# Patient Record
Sex: Male | Born: 1980
Health system: Southern US, Community
[De-identification: ages and names within clinical notes are randomized; demographics above are authoritative.]

## PROBLEM LIST (undated history)

## (undated) DIAGNOSIS — E291 Testicular hypofunction: Secondary | ICD-10-CM

## (undated) DIAGNOSIS — I4819 Other persistent atrial fibrillation: Secondary | ICD-10-CM

## (undated) DIAGNOSIS — F17200 Nicotine dependence, unspecified, uncomplicated: Secondary | ICD-10-CM

## (undated) DIAGNOSIS — G479 Sleep disorder, unspecified: Secondary | ICD-10-CM

## (undated) DIAGNOSIS — G43109 Migraine with aura, not intractable, without status migrainosus: Secondary | ICD-10-CM

## (undated) DIAGNOSIS — N50819 Testicular pain, unspecified: Secondary | ICD-10-CM

## (undated) DIAGNOSIS — F988 Other specified behavioral and emotional disorders with onset usually occurring in childhood and adolescence: Secondary | ICD-10-CM

## (undated) DIAGNOSIS — I499 Cardiac arrhythmia, unspecified: Secondary | ICD-10-CM

## (undated) HISTORY — DX: Sleep disorder, unspecified: G47.9

## (undated) HISTORY — DX: Migraine with aura, not intractable, without status migrainosus: G43.109

## (undated) HISTORY — PX: OTHER SURGICAL HISTORY: SHX169

## (undated) HISTORY — DX: Cardiac arrhythmia, unspecified: I49.9

## (undated) HISTORY — DX: Other specified behavioral and emotional disorders with onset usually occurring in childhood and adolescence: F98.8

## (undated) HISTORY — DX: Nicotine dependence, unspecified, uncomplicated: F17.200

## (undated) HISTORY — DX: Testicular pain, unspecified: N50.819

## (undated) HISTORY — DX: Other persistent atrial fibrillation: I48.19

## (undated) HISTORY — DX: Testicular hypofunction: E29.1

---
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
## 2002-02-19 ENCOUNTER — Encounter: Admission: RE | Admit: 2002-02-19 | Discharge: 2002-02-19 | Payer: Self-pay | Admitting: Sports Medicine

## 2003-04-27 ENCOUNTER — Emergency Department (HOSPITAL_COMMUNITY): Admission: EM | Admit: 2003-04-27 | Discharge: 2003-04-27 | Payer: Self-pay

## 2004-12-24 ENCOUNTER — Ambulatory Visit: Payer: Self-pay | Admitting: Family Medicine

## 2005-06-07 ENCOUNTER — Encounter: Admission: RE | Admit: 2005-06-07 | Discharge: 2005-06-07 | Payer: Self-pay | Admitting: Sports Medicine

## 2006-05-19 ENCOUNTER — Ambulatory Visit: Payer: Self-pay | Admitting: Family Medicine

## 2006-06-23 DIAGNOSIS — M25569 Pain in unspecified knee: Secondary | ICD-10-CM | POA: Insufficient documentation

## 2006-08-04 ENCOUNTER — Encounter (INDEPENDENT_AMBULATORY_CARE_PROVIDER_SITE_OTHER): Payer: Self-pay | Admitting: Family Medicine

## 2006-08-04 ENCOUNTER — Ambulatory Visit: Payer: Self-pay | Admitting: Family Medicine

## 2006-08-04 DIAGNOSIS — N63 Unspecified lump in unspecified breast: Secondary | ICD-10-CM | POA: Insufficient documentation

## 2006-08-04 DIAGNOSIS — R3589 Other polyuria: Secondary | ICD-10-CM | POA: Insufficient documentation

## 2006-08-04 DIAGNOSIS — R358 Other polyuria: Secondary | ICD-10-CM

## 2006-08-04 LAB — CONVERTED CEMR LAB
BUN: 15 mg/dL (ref 6–23)
CO2: 23 meq/L (ref 19–32)
Calcium: 9.5 mg/dL (ref 8.4–10.5)
Chlamydia, Swab/Urine, PCR: NEGATIVE
Chloride: 106 meq/L (ref 96–112)
Cholesterol: 157 mg/dL (ref 0–200)
Creatinine, Ser: 1.09 mg/dL (ref 0.40–1.50)
GC Probe Amp, Urine: NEGATIVE
Glucose, Bld: 93 mg/dL (ref 70–99)
HDL: 55 mg/dL (ref >39)
LDL Cholesterol: 90 mg/dL (ref 0–99)
Potassium: 4.1 meq/L (ref 3.5–5.3)
Sodium: 142 meq/L (ref 135–145)
Total CHOL/HDL Ratio: 2.9
Triglycerides: 59 mg/dL (ref <150)
VLDL: 12 mg/dL (ref 0–40)

## 2006-08-09 ENCOUNTER — Encounter (INDEPENDENT_AMBULATORY_CARE_PROVIDER_SITE_OTHER): Payer: Self-pay | Admitting: Family Medicine

## 2006-08-17 ENCOUNTER — Encounter (INDEPENDENT_AMBULATORY_CARE_PROVIDER_SITE_OTHER): Payer: Self-pay | Admitting: Family Medicine

## 2009-01-22 ENCOUNTER — Ambulatory Visit: Payer: Self-pay | Admitting: Family Medicine

## 2009-01-22 ENCOUNTER — Encounter: Payer: Self-pay | Admitting: Family Medicine

## 2009-01-29 ENCOUNTER — Telehealth: Payer: Self-pay | Admitting: Family Medicine

## 2009-01-31 LAB — CONVERTED CEMR LAB
ALT: 34 units/L (ref 0–53)
AST: 19 units/L (ref 0–37)
Albumin: 4.2 g/dL (ref 3.5–5.2)
Alkaline Phosphatase: 81 units/L (ref 39–117)
BUN: 13 mg/dL (ref 6–23)
CO2: 24 meq/L (ref 19–32)
Calcium: 9.3 mg/dL (ref 8.4–10.5)
Chloride: 109 meq/L (ref 96–112)
Creatinine, Ser: 1.02 mg/dL (ref 0.40–1.50)
Glucose, Bld: 108 mg/dL — ABNORMAL HIGH (ref 70–99)
Potassium: 4.1 meq/L (ref 3.5–5.3)
Sodium: 143 meq/L (ref 135–145)
TSH: 1.102 microintl units/mL (ref 0.350–4.500)
Testosterone: 270.13 ng/dL — ABNORMAL LOW (ref 350–890)
Total Bilirubin: 0.5 mg/dL (ref 0.3–1.2)
Total Protein: 6.5 g/dL (ref 6.0–8.3)
hCG, Beta Chain, Quant, S: <2 milliintl units/mL

## 2009-02-21 ENCOUNTER — Telehealth: Payer: Self-pay | Admitting: Family Medicine

## 2009-03-03 ENCOUNTER — Ambulatory Visit: Payer: Self-pay | Admitting: Family Medicine

## 2009-03-03 DIAGNOSIS — E291 Testicular hypofunction: Secondary | ICD-10-CM | POA: Insufficient documentation

## 2009-03-19 ENCOUNTER — Encounter: Admission: RE | Admit: 2009-03-19 | Discharge: 2009-03-19 | Payer: Self-pay | Admitting: Family Medicine

## 2010-10-20 ENCOUNTER — Encounter: Payer: Self-pay | Admitting: Family Medicine

## 2010-10-20 ENCOUNTER — Ambulatory Visit (INDEPENDENT_AMBULATORY_CARE_PROVIDER_SITE_OTHER): Payer: Self-pay | Admitting: Family Medicine

## 2010-10-20 DIAGNOSIS — F988 Other specified behavioral and emotional disorders with onset usually occurring in childhood and adolescence: Secondary | ICD-10-CM

## 2010-10-20 NOTE — Assessment & Plan Note (Signed)
Will attempt to obtain paper charts. Will have patient follow with [facility] for further evaluation. No symptoms concerning for mood disorder, thyroid dysfunction, or sleep apnea as primary cause. Follow up after this evaluation.

## 2010-10-20 NOTE — Progress Notes (Signed)
  Subjective:    Patient ID: Roger Mitchell, male    DOB: 05/30/1980, 30 y.o.   MRN: [id number]  HPI  1) Attention Deficit Disorder: Reports history of ADD diagnosed at age 34. Reports that he had been on Ritalin during middle and high school, stopped taking when he went to college. Did continue to have some difficulties with focusing during college, but did well. Notes that he has difficulties focusing at work - especially on tasks that require close observation or sustained attention (specifically did poorly in these areas on tests related to work). Ritalin did cause some headache at higher doses but he tolerated it well during childhood.   Denies snoring, poor sleep, insomnia, daytime somnolence, depressive symptoms, manic symptoms, hyper / hypothyroid symptoms, medication or drug use, weight loss, appetite change, headaches, vision issues,    Pertinent past history reviewed - unable to pull paper charts from childhood evaluation for ADD   Review of Systems As per HPI     Objective:   Physical Exam General:  Well-developed,well-nourished,in no acute distress; alert,appropriate and cooperative throughout examination Psych: PHQ score = 3, pleasant affect with congruent mood, no depressive manifestations.        Assessment & Plan:

## 2010-10-20 NOTE — Patient Instructions (Signed)
We will attempt to get your old records regarding your ADD history. I would like you to call the number provided in order to have further testing done - this will determine the level of treatment.  Follow up with [place] after you are seen by the ADD clinic - if medication needs to be provided we will do this.   Dr. Wallene Huh

## 2015-08-11 ENCOUNTER — Other Ambulatory Visit: Payer: Self-pay | Admitting: Physician Assistant

## 2015-08-11 DIAGNOSIS — N6012 Diffuse cystic mastopathy of left breast: Secondary | ICD-10-CM

## 2015-08-22 ENCOUNTER — Other Ambulatory Visit: Payer: Self-pay

## 2015-08-28 ENCOUNTER — Other Ambulatory Visit: Payer: Self-pay

## 2015-08-29 ENCOUNTER — Other Ambulatory Visit: Payer: Self-pay

## 2016-12-17 DIAGNOSIS — N50812 Left testicular pain: Secondary | ICD-10-CM | POA: Diagnosis not present

## 2017-01-13 DIAGNOSIS — F900 Attention-deficit hyperactivity disorder, predominantly inattentive type: Secondary | ICD-10-CM | POA: Diagnosis not present

## 2017-03-21 DIAGNOSIS — Z79891 Long term (current) use of opiate analgesic: Secondary | ICD-10-CM | POA: Diagnosis not present

## 2017-03-21 DIAGNOSIS — Z5181 Encounter for therapeutic drug level monitoring: Secondary | ICD-10-CM | POA: Diagnosis not present

## 2017-09-23 DIAGNOSIS — F17298 Nicotine dependence, other tobacco product, with other nicotine-induced disorders: Secondary | ICD-10-CM | POA: Insufficient documentation

## 2018-01-15 NOTE — Progress Notes (Deleted)
    Cardiology Office Note   Date:  01/15/2018   ID:  Roger Mitchell, DOB 03/24/1981, MRN [id number]  PCP:  No primary care provider on file.  Cardiologist:   Mabelle Mungin [place], MD   No chief complaint on file.     History of Present Illness: Roger Mitchell is a 37 y.o. male who is seen at the request of Briscoe BurnsScott Slate PA for evaluation of arrhythmia.    No past medical history on file.  *** The histories are not reviewed yet. Please review them in the "History" navigator section and refresh this SmartLink.   No current outpatient medications on file.   No current facility-administered medications for this visit.     Allergies:   Patient has no known allergies.    Social History:  The patient  reports that he has never smoked. He has never used smokeless tobacco.   Family History:  The patient's ***family history is not on file.    ROS:  Please see the history of present illness.   Otherwise, review of systems are positive for {NONE DEFAULTED:18576::"none"}.   All other systems are reviewed and negative.    PHYSICAL EXAM: VS:  There were no vitals taken for this visit. , BMI There is no height or weight on file to calculate BMI. GEN: Well nourished, well developed, in no acute distress  HEENT: normal  Neck: no JVD, carotid bruits, or masses Cardiac: ***RRR; no murmurs, rubs, or gallops,no edema  Respiratory:  clear to auscultation bilaterally, normal work of breathing GI: soft, nontender, nondistended, + BS MS: no deformity or atrophy  Skin: warm and dry, no rash Neuro:  Strength and sensation are intact Psych: euthymic mood, full affect   EKG:  EKG {ACTION; IS/IS WUJ:[id number]}OT:[id number]} ordered today. The ekg ordered today demonstrates ***   Recent Labs: No results found for requested labs within last 8760 hours.    Lipid Panel    Component Value Date/Time   CHOL 157 08/04/2006 2040   TRIG 59 08/04/2006 2040   HDL 55 08/04/2006 2040   CHOLHDL 2.9 Ratio 08/04/2006  [id number]   VLDL 12 08/04/2006 2040   LDLCALC 90 08/04/2006 2040    Labs dated 04/30/14: cholesterol 205, triglycerides 114, HDL 55, LDL 127.  Dated 03/21/17: creatinine 1.3. Other chemistries and Hgb normal.  Wt Readings from Last 3 Encounters:  10/20/10 179 lb 6.4 oz (81.4 kg)      Other studies Reviewed: Additional studies/ records that were reviewed today include: ***. Review of the above records demonstrates: ***   ASSESSMENT AND PLAN:  1.  ***   Current medicines are reviewed at length with the patient today.  The patient {ACTIONS; HAS/DOES NOT HAVE:19233} concerns regarding medicines.  The following changes have been made:  {PLAN; NO CHANGE:13088:s}  Labs/ tests ordered today include: *** No orders of the defined types were placed in this encounter.    Disposition:   FU with *** in {gen number 2-13:086578}0-10:310397} {Days to years:10300}  Signed, Deni Lefever [place], MD  01/15/2018 2:09 PM    [facility] [address], [place], [place], [id number] Phone [phone], Fax [phone]

## 2018-01-16 ENCOUNTER — Ambulatory Visit: Payer: Self-pay | Admitting: Cardiology

## 2018-01-17 ENCOUNTER — Encounter: Payer: Self-pay | Admitting: *Deleted

## 2018-01-20 NOTE — Progress Notes (Signed)
Referring-Scott Quay Burow PA-C Reason for referral-atrial fibrillation  HPI: 37 year old male for evaluation of atrial fibrillation at request of [employer] PA-C.  Patient apparently was to give blood with [facility] and was found to have an irregular heartbeat.  An electrocardiogram was said to show atrial fibrillation.  Cardiology asked to evaluate.  Note we do not have the electrocardiogram available.  He otherwise is asymptomatic.  He denies dyspnea, chest pain, palpitations or syncope.  Current Outpatient Medications  Medication Sig Dispense Refill  . aspirin 325 MG EC tablet Take 325 mg by mouth daily.    . Melatonin 3 MG TABS Take 2 tablets by mouth at bedtime. As needed for sleep     No current facility-administered medications for this visit.     No Known Allergies   Past Medical History:  Diagnosis Date  . ADD (attention deficit disorder) without hyperactivity   . Atrial fibrillation, persistent   . Irregular heart beat   . Nicotine dependence   . Ocular migraine   . Pain in testicle   . Sleep disorder    shift work  . Testicular hypofunction     Past Surgical History:  Procedure Laterality Date  . none      Social History   Socioeconomic History  . Marital status: Single    Spouse name: Not on file  . Number of children: 1  . Years of education: Not on file  . Highest education level: Not on file  Occupational History  . Not on file  Social Needs  . Financial resource strain: Not on file  . Food insecurity:    Worry: Not on file    Inability: Not on file  . Transportation needs:    Medical: Not on file    Non-medical: Not on file  Tobacco Use  . Smoking status: Never Smoker  . Smokeless tobacco: Never Used  Substance and Sexual Activity  . Alcohol use: Yes    Comment: 6 glasses wine per week  . Drug use: Never  . Sexual activity: Not on file  Lifestyle  . Physical activity:    Days per week: Not on file    Minutes per session: Not  on file  . Stress: Not on file  Relationships  . Social connections:    Talks on phone: Not on file    Gets together: Not on file    Attends religious service: Not on file    Active member of club or organization: Not on file    Attends meetings of clubs or organizations: Not on file    Relationship status: Not on file  . Intimate partner violence:    Fear of current or ex partner: Not on file    Emotionally abused: Not on file    Physically abused: Not on file    Forced sexual activity: Not on file  Other Topics Concern  . Not on file  Social History Narrative  . Not on file    Family History  Problem Relation Age of Onset  . Hypertension Mother   . Hypertension Father   . Hypertension Maternal Grandmother   . Hypertension Maternal Grandfather     ROS: no fevers or chills, productive cough, hemoptysis, dysphasia, odynophagia, melena, hematochezia, dysuria, hematuria, rash, seizure activity, orthopnea, PND, pedal edema, claudication. Remaining systems are negative.  Physical Exam:   Blood pressure 116/80, pulse (!) 59, height 5\' 8"  (1.727 m), weight 184 lb (83.5 kg).  General:  Well developed/well nourished in NAD Skin warm/dry Patient not depressed No peripheral clubbing Back-normal HEENT-normal/normal eyelids Neck supple/normal carotid upstroke bilaterally; no bruits; no JVD; no thyromegaly chest - CTA/ normal expansion CV - RRR/normal S1 and S2; no murmurs, rubs or gallops;  PMI nondisplaced Abdomen -NT/ND, no HSM, no mass, + bowel sounds, no bruit 2+ femoral pulses, no bruits Ext-no edema, chords, 2+ DP Neuro-grossly nonfocal  ECG -sinus bradycardia at a rate of 59.  No ST changes.  Personally reviewed  A/P  1 paroxysmal atrial fibrillation-patient has been diagnosed with atrial fibrillation.  He is in sinus rhythm today.  I will ask for the electrocardiograms to confirm his diagnosis.  Check TSH and echocardiogram.  He has no embolic risk factors. CHADSvasc  0.  We will continue aspirin but decreased to 81 mg daily.  If atrial fibrillation is documented we will consider adding an AV nodal blocking agent.  2 Vaping-pt counseled on discontinuing.  3 ADD-per primary care.  Olga Millers, MD

## 2018-01-24 ENCOUNTER — Encounter: Payer: Self-pay | Admitting: Cardiology

## 2018-01-25 ENCOUNTER — Ambulatory Visit (INDEPENDENT_AMBULATORY_CARE_PROVIDER_SITE_OTHER): Payer: BLUE CROSS/BLUE SHIELD | Admitting: Cardiology

## 2018-01-25 ENCOUNTER — Encounter: Payer: Self-pay | Admitting: Cardiology

## 2018-01-25 VITALS — BP 116/80 | HR 59 | Ht 68.0 in | Wt 184.0 lb

## 2018-01-25 DIAGNOSIS — I480 Paroxysmal atrial fibrillation: Secondary | ICD-10-CM

## 2018-01-25 LAB — TSH: TSH: 1.78 u[IU]/mL (ref 0.450–4.500)

## 2018-01-25 MED ORDER — ASPIRIN EC 81 MG PO TBEC: 81.0000 mg | DELAYED_RELEASE_TABLET | Freq: Every day | ORAL | Status: DC

## 2018-01-25 NOTE — Patient Instructions (Signed)
Medication Instructions:   DECREASE ASPIRIN TO 81 MG ONCE DAILY  Labwork:  Your physician recommends that you HAVE LAB WORK TODAY  Testing/Procedures:  Your physician has requested that you have an echocardiogram. Echocardiography is a painless test that uses sound waves to create images of your heart. It provides your doctor with information about the size and shape of your heart and how well your heart's chambers and valves are working. This procedure takes approximately one hour. There are no restrictions for this procedure.    Follow-Up:  Your physician wants you to follow-up in: 4 MONTHS WITH DR Jens Som You will receive a reminder letter in the mail two months in advance. If you don't receive a letter, please call our office to schedule the follow-up appointment.

## 2018-01-26 ENCOUNTER — Encounter: Payer: Self-pay | Admitting: *Deleted

## 2018-01-30 ENCOUNTER — Ambulatory Visit: Payer: Self-pay | Admitting: Cardiovascular Disease

## 2018-02-14 ENCOUNTER — Other Ambulatory Visit (HOSPITAL_COMMUNITY): Payer: BLUE CROSS/BLUE SHIELD

## 2018-02-14 ENCOUNTER — Telehealth: Payer: Self-pay | Admitting: *Deleted

## 2018-02-14 DIAGNOSIS — R0989 Other specified symptoms and signs involving the circulatory and respiratory systems: Secondary | ICD-10-CM

## 2018-02-14 NOTE — Telephone Encounter (Signed)
EKG from Roger slate pa reviewed by dr Jens Som and shows atrial fib. Left message for pt to call, per dr Jens Som patient to start toprol 25 mg once daily and to follow up as scheduled.

## 2018-02-28 ENCOUNTER — Encounter (HOSPITAL_COMMUNITY): Payer: Self-pay | Admitting: Cardiology

## 2018-03-09 ENCOUNTER — Telehealth: Payer: Self-pay

## 2018-03-09 NOTE — Telephone Encounter (Signed)
New message    Just an FYI. We have made several attempts to contact this patient including sending a letter to schedule or reschedule their echocardiogram. We will be removing the patient from the echo WQ.   Thank you 

## 2018-03-27 ENCOUNTER — Encounter: Payer: Self-pay | Admitting: *Deleted

## 2018-03-27 NOTE — Telephone Encounter (Signed)
Letter mailed to the patient asking him to call

## 2018-05-05 ENCOUNTER — Telehealth: Payer: Self-pay | Admitting: Cardiology

## 2018-05-05 MED ORDER — METOPROLOL SUCCINATE ER 25 MG PO TB24: 25.0000 mg | ORAL_TABLET | Freq: Every day | ORAL | 3 refills | Status: DC

## 2018-05-05 NOTE — Telephone Encounter (Signed)
New Message   Patient is calling in reference to a letter that he received about his echocardiogram dated 12/02 saying that he has Afib. . Please call to discuss.

## 2018-05-05 NOTE — Telephone Encounter (Signed)
Spoke with pt, he is not having any palpitations or problems at this time. He has a lot of questions regarding atrial fib and the need for the metropolol. Tried to answer his questions. New script sent to the pharmacy. He has a follow up appt 05-29-18.

## 2018-05-05 NOTE — Telephone Encounter (Signed)
Follow up:   Patient returning call concerning some paper work. Please call patient.

## 2018-05-10 ENCOUNTER — Telehealth: Payer: Self-pay | Admitting: Cardiology

## 2018-05-10 MED ORDER — METOPROLOL SUCCINATE ER 25 MG PO TB24: 25.0000 mg | ORAL_TABLET | Freq: Every day | ORAL | 3 refills | Status: DC

## 2018-05-10 NOTE — Telephone Encounter (Signed)
Dr. Eula Fried updated with Dr. Ludwig Clarks recommendation. He states pt was seen in the office today and reports he medication was never sent to his pharmacy. Contacted pt who states Rx was supposed to be sent to [facility] instead of [facility]. New Rx sent to correct pharmacy. Pt made aware.

## 2018-05-10 NOTE — Telephone Encounter (Signed)
New Message   Dr is with pt and is calling because the pt is not sure what his medical plan is with [place] . Please call

## 2018-05-17 NOTE — Progress Notes (Signed)
HPI: FU atrial fibrillation.  Patient apparently was to give blood with [facility] and was found to have an irregular heartbeat.  An electrocardiogram January 12, 2018 showed atrial fibrillation.  At last office visit patient was in sinus rhythm.  Echo ordered at last office visit but not performed.  TSH normal.  Since last seen the patient denies any dyspnea on exertion, orthopnea, PND, pedal edema, palpitations, syncope or chest pain.   Current Outpatient Medications  Medication Sig Dispense Refill  . amphetamine-dextroamphetamine (ADDERALL XR) 10 MG 24 hr capsule Take 10 mg by mouth daily.    Marland Kitchen. aspirin 81 MG tablet Take 1 tablet (81 mg total) by mouth daily.    . Melatonin 3 MG TABS Take 2 tablets by mouth at bedtime. As needed for sleep    . metoprolol succinate (TOPROL XL) 25 MG 24 hr tablet Take 1 tablet (25 mg total) by mouth daily. 30 tablet 3   No current facility-administered medications for this visit.      Past Medical History:  Diagnosis Date  . ADD (attention deficit disorder) without hyperactivity   . Atrial fibrillation, persistent   . Irregular heart beat   . Nicotine dependence   . Ocular migraine   . Pain in testicle   . Sleep disorder    shift work  . Testicular hypofunction     Past Surgical History:  Procedure Laterality Date  . none      Social History   Socioeconomic History  . Marital status: Single    Spouse name: Not on file  . Number of children: 1  . Years of education: Not on file  . Highest education level: Not on file  Occupational History  . Not on file  Social Needs  . Financial resource strain: Not on file  . Food insecurity:    Worry: Not on file    Inability: Not on file  . Transportation needs:    Medical: Not on file    Non-medical: Not on file  Tobacco Use  . Smoking status: Never Smoker  . Smokeless tobacco: Never Used  Substance and Sexual Activity  . Alcohol use: Yes    Comment: 6 glasses wine per week  .  Drug use: Never  . Sexual activity: Not on file  Lifestyle  . Physical activity:    Days per week: Not on file    Minutes per session: Not on file  . Stress: Not on file  Relationships  . Social connections:    Talks on phone: Not on file    Gets together: Not on file    Attends religious service: Not on file    Active member of club or organization: Not on file    Attends meetings of clubs or organizations: Not on file    Relationship status: Not on file  . Intimate partner violence:    Fear of current or ex partner: Not on file    Emotionally abused: Not on file    Physically abused: Not on file    Forced sexual activity: Not on file  Other Topics Concern  . Not on file  Social History Narrative  . Not on file    Family History  Problem Relation Age of Onset  . Hypertension Mother   . Hypertension Father   . Hypertension Maternal Grandmother   . Hypertension Maternal Grandfather     ROS: no fevers or chills, productive cough, hemoptysis, dysphasia, odynophagia, melena, hematochezia, dysuria,  hematuria, rash, seizure activity, orthopnea, PND, pedal edema, claudication. Remaining systems are negative.  Physical Exam: Well-developed well-nourished in no acute distress.  Skin is warm and dry.  HEENT is normal.  Neck is supple.  Chest is clear to auscultation with normal expansion.  Cardiovascular exam is regular rate and rhythm.  Abdominal exam nontender or distended. No masses palpated. Extremities show no edema. neuro grossly intact  A/P  1 patient is in sinus rhythm today on examination.  Previous electrocardiogram confirmed the diagnosis of atrial fibrillation.  I have asked him to proceed with echocardiogram to assess LV function. CHADSvasc 0.  Continue aspirin 81 mg daily.  Continue Toprol 25 mg daily.  2 history of vaping-patient again counseled on discontinuing.  3 ADD-Per primary care.  Olga MillersBrian Crenshaw, MD

## 2018-05-29 ENCOUNTER — Ambulatory Visit (INDEPENDENT_AMBULATORY_CARE_PROVIDER_SITE_OTHER): Payer: BLUE CROSS/BLUE SHIELD | Admitting: Cardiology

## 2018-05-29 ENCOUNTER — Encounter: Payer: Self-pay | Admitting: Cardiology

## 2018-05-29 VITALS — BP 116/86 | HR 67 | Ht 68.0 in | Wt 185.0 lb

## 2018-05-29 DIAGNOSIS — I480 Paroxysmal atrial fibrillation: Secondary | ICD-10-CM | POA: Diagnosis not present

## 2018-05-29 NOTE — Patient Instructions (Signed)
Medication Instructions:  NO CHANGE If you need a refill on your cardiac medications before your next appointment, please call your pharmacy.   Lab work: If you have labs (blood work) drawn today and your tests are completely normal, you will receive your results only by: . MyChart Message (if you have MyChart) OR . A paper copy in the mail If you have any lab test that is abnormal or we need to change your treatment, we will call you to review the results.  Testing/Procedures: Your physician has requested that you have an echocardiogram. Echocardiography is a painless test that uses sound waves to create images of your heart. It provides your doctor with information about the size and shape of your heart and how well your heart's chambers and valves are working. This procedure takes approximately one hour. There are no restrictions for this procedure.  [address]  Follow-Up: At [facility], you and your health needs are our priority.  As part of our continuing mission to provide you with exceptional heart care, we have created designated Provider Care Teams.  These Care Teams include your primary Cardiologist (physician) and Advanced Practice Providers (APPs -  Physician Assistants and Nurse Practitioners) who all work together to provide you with the care you need, when you need it. You will need a follow up appointment in 12 months.  Please call our office 2 months in advance to schedule this appointment.  You may see BRIAN CRENSHAW MD or one of the following Advanced Practice Providers on your designated Care Team:   Luke Kilroy, PA-C Krista Kroeger, PA-C . Callie Goodrich, PA-C CALL IN December TO SCHEDULE APPOINTMENT IN FEBRUARY  

## 2018-06-07 ENCOUNTER — Other Ambulatory Visit (HOSPITAL_COMMUNITY): Payer: BLUE CROSS/BLUE SHIELD

## 2018-06-13 ENCOUNTER — Other Ambulatory Visit (HOSPITAL_COMMUNITY): Payer: BLUE CROSS/BLUE SHIELD

## 2018-06-13 DIAGNOSIS — R0989 Other specified symptoms and signs involving the circulatory and respiratory systems: Secondary | ICD-10-CM

## 2018-06-20 ENCOUNTER — Encounter: Payer: Self-pay | Admitting: Cardiology

## 2018-07-03 ENCOUNTER — Other Ambulatory Visit (HOSPITAL_COMMUNITY): Payer: BLUE CROSS/BLUE SHIELD

## 2018-07-07 ENCOUNTER — Telehealth: Payer: Self-pay

## 2018-07-07 NOTE — Telephone Encounter (Signed)
Forward to Dr Crenshaw 

## 2018-07-07 NOTE — Telephone Encounter (Signed)
New message    Just an FYI.  We have made several attempts to contact this patient including sending a letter to schedule or reschedule their echocardiogram. We will be removing the patient from the echo WQ.  02/14/2018   cancel   06/07/2018     cancel 06/13/2018   patient no show   3/9/ 2020     patient no show    Thank you

## 2018-10-24 DIAGNOSIS — I4891 Unspecified atrial fibrillation: Secondary | ICD-10-CM | POA: Diagnosis not present

## 2018-12-01 DIAGNOSIS — F900 Attention-deficit hyperactivity disorder, predominantly inattentive type: Secondary | ICD-10-CM | POA: Diagnosis not present

## 2018-12-15 ENCOUNTER — Encounter (HOSPITAL_COMMUNITY): Payer: Self-pay | Admitting: *Deleted

## 2018-12-15 ENCOUNTER — Emergency Department (HOSPITAL_COMMUNITY)
Admission: EM | Admit: 2018-12-15 | Discharge: 2018-12-16 | Disposition: A | Discharge status: Home / Self Care | Payer: BC Managed Care – PPO | Attending: Emergency Medicine | Admitting: Emergency Medicine

## 2018-12-15 ENCOUNTER — Emergency Department (HOSPITAL_COMMUNITY): Payer: BC Managed Care – PPO

## 2018-12-15 ENCOUNTER — Other Ambulatory Visit: Payer: Self-pay

## 2018-12-15 DIAGNOSIS — Z7982 Long term (current) use of aspirin: Secondary | ICD-10-CM | POA: Insufficient documentation

## 2018-12-15 DIAGNOSIS — R110 Nausea: Secondary | ICD-10-CM | POA: Diagnosis not present

## 2018-12-15 DIAGNOSIS — R0789 Other chest pain: Secondary | ICD-10-CM | POA: Insufficient documentation

## 2018-12-15 DIAGNOSIS — R079 Chest pain, unspecified: Secondary | ICD-10-CM | POA: Diagnosis not present

## 2018-12-15 DIAGNOSIS — R1013 Epigastric pain: Secondary | ICD-10-CM | POA: Insufficient documentation

## 2018-12-15 DIAGNOSIS — Z79899 Other long term (current) drug therapy: Secondary | ICD-10-CM | POA: Diagnosis not present

## 2018-12-15 LAB — BASIC METABOLIC PANEL
Anion gap: 11 (ref 5–15)
BUN: 15 mg/dL (ref 6–20)
CO2: 24 mmol/L (ref 22–32)
Calcium: 9.1 mg/dL (ref 8.9–10.3)
Chloride: 103 mmol/L (ref 98–111)
Creatinine, Ser: 1.27 mg/dL — ABNORMAL HIGH (ref 0.61–1.24)
GFR calc Af Amer: >60 mL/min (ref >60)
GFR calc non Af Amer: >60 mL/min (ref >60)
Glucose, Bld: 88 mg/dL (ref 70–99)
Potassium: 4 mmol/L (ref 3.5–5.1)
Sodium: 138 mmol/L (ref 135–145)

## 2018-12-15 LAB — CBC
HCT: 46.4 % (ref 39.0–52.0)
Hemoglobin: 16.6 g/dL (ref 13.0–17.0)
MCH: 31.6 pg (ref 26.0–34.0)
MCHC: 35.8 g/dL (ref 30.0–36.0)
MCV: 88.4 fL (ref 80.0–100.0)
Platelets: 248 10*3/uL (ref 150–400)
RBC: 5.25 MIL/uL (ref 4.22–5.81)
RDW: 11.4 % — ABNORMAL LOW (ref 11.5–15.5)
WBC: 6.9 10*3/uL (ref 4.0–10.5)
nRBC: 0 % (ref 0.0–0.2)

## 2018-12-15 LAB — TROPONIN I (HIGH SENSITIVITY): Troponin I (High Sensitivity): 3 ng/L (ref <18)

## 2018-12-15 MED ORDER — SODIUM CHLORIDE 0.9% FLUSH: 3.0000 mL | Freq: Once | INTRAVENOUS | Status: DC

## 2018-12-15 NOTE — ED Triage Notes (Signed)
The pt had an episode earlier today with weakness nausea and some xchest pain  He went to urgent care and they saw something on his his ekg  He has a history of af

## 2018-12-16 LAB — TROPONIN I (HIGH SENSITIVITY): Troponin I (High Sensitivity): 3 ng/L (ref <18)

## 2018-12-16 MED ORDER — PANTOPRAZOLE SODIUM 20 MG PO TBEC: 20.0000 mg | DELAYED_RELEASE_TABLET | Freq: Every day | ORAL | 0 refills | Status: DC

## 2018-12-16 MED ORDER — ALUM & MAG HYDROXIDE-SIMETH 200-200-20 MG/5ML PO SUSP
30.0000 mL | Freq: Once | ORAL | Status: AC
  Administered 2018-12-16: 30 mL via ORAL
  Filled 2018-12-16: qty 30

## 2018-12-16 MED ORDER — FAMOTIDINE 20 MG PO TABS: 20.0000 mg | ORAL_TABLET | Freq: Two times a day (BID) | ORAL | 0 refills | Status: DC

## 2018-12-16 NOTE — ED Provider Notes (Signed)
[facility] Provider Note   CSN: [id number] Arrival date & time: 12/15/18  2115     History   Chief Complaint Chief Complaint  Patient presents with  . Chest Pain    HPI Roger Mitchell is a 38 y.o. male.     Patient with some intermittent epigastric retrosternal chest pain.  Patient states that this is been going on for a couple days intermittently.  Went to urgent care they were worried about an EKG so sent him here with her work-up.  No cough or fevers.  The history is provided by the patient.  Chest Pain Pain location:  L chest and substernal area Pain quality: sharp   Pain quality: not aching   Pain radiates to:  Does not radiate Pain severity:  Mild Duration:  2 days Timing:  Intermittent Chronicity:  New Relieved by:  None tried Worsened by:  Nothing Ineffective treatments:  None tried Associated symptoms: abdominal pain and nausea   Associated symptoms: no back pain, no cough, no fever, no heartburn, no lower extremity edema and no near-syncope     Past Medical History:  Diagnosis Date  . ADD (attention deficit disorder) without hyperactivity   . Atrial fibrillation, persistent   . Irregular heart beat   . Nicotine dependence   . Ocular migraine   . Pain in testicle   . Sleep disorder    shift work  . Testicular hypofunction     Patient Active Problem List   Diagnosis Date Noted  . Adult attention deficit disorder 10/20/2010  . TESTICULAR HYPOFUNCTION 03/03/2009  . BREAST MASS, LEFT 08/04/2006  . SYMPTOM, POLYURIA 08/04/2006  . PATELLO FEMORAL STRESS SYNDROME 06/23/2006    Past Surgical History:  Procedure Laterality Date  . none          Home Medications    Prior to Admission medications   Medication Sig Start Date End Date Taking? Authorizing Provider  amphetamine-dextroamphetamine (ADDERALL XR) 10 MG 24 hr capsule Take 10 mg by mouth daily.    [provider]  aspirin 81 MG tablet  Take 1 tablet (81 mg total) by mouth daily. 01/25/18   Lewayne Buntingrenshaw, Brian S, MD  famotidine (PEPCID) 20 MG tablet Take 1 tablet (20 mg total) by mouth 2 (two) times daily. 12/16/18   Armella Stogner, Barbara CowerJason, MD  Melatonin 3 MG TABS Take 2 tablets by mouth at bedtime. As needed for sleep    [provider]  metoprolol succinate (TOPROL XL) 25 MG 24 hr tablet Take 1 tablet (25 mg total) by mouth daily. 05/10/18   Lewayne Buntingrenshaw, Brian S, MD  pantoprazole (PROTONIX) 20 MG tablet Take 1 tablet (20 mg total) by mouth daily. 12/16/18   Kairee Isa, Barbara CowerJason, MD    Family History Family History  Problem Relation Age of Onset  . Hypertension Mother   . Hypertension Father   . Hypertension Maternal Grandmother   . Hypertension Maternal Grandfather     Social History Social History   Tobacco Use  . Smoking status: Never Smoker  . Smokeless tobacco: Never Used  Substance Use Topics  . Alcohol use: Yes    Comment: 6 glasses wine per week  . Drug use: Never     Allergies   Patient has no known allergies.   Review of Systems Review of Systems  Constitutional: Negative for fever.  Respiratory: Negative for cough.   Cardiovascular: Positive for chest pain. Negative for near-syncope.  Gastrointestinal: Positive for abdominal pain and nausea.  Negative for heartburn.  Musculoskeletal: Negative for back pain.  All other systems reviewed and are negative.    Physical Exam Updated Vital Signs BP (!) 140/91   Pulse 70   Temp 98.8 F (37.1 C) (Oral)   Resp 13   Ht 5\' 8"  (1.727 m)   Wt 83.5 kg   SpO2 98%   BMI 27.98 kg/m   Physical Exam Vitals signs and nursing note reviewed.  Constitutional:      Appearance: He is well-developed.  HENT:     Head: Normocephalic and atraumatic.     Nose: Nose normal. No congestion or rhinorrhea.     Mouth/Throat:     Mouth: Mucous membranes are dry.     Pharynx: Oropharynx is clear.  Eyes:     Conjunctiva/sclera: Conjunctivae normal.  Neck:     Musculoskeletal:  Normal range of motion.  Cardiovascular:     Rate and Rhythm: Normal rate.  Pulmonary:     Effort: Pulmonary effort is normal. No respiratory distress.  Abdominal:     General: There is no distension.  Musculoskeletal: Normal range of motion.  Skin:    General: Skin is warm and dry.  Neurological:     General: No focal deficit present.     Mental Status: He is alert.      ED Treatments / Results  Labs (all labs ordered are listed, but only abnormal results are displayed) Labs Reviewed  BASIC METABOLIC PANEL - Abnormal; Notable for the following components:      Result Value   Creatinine, Ser 1.27 (*)    All other components within normal limits  CBC - Abnormal; Notable for the following components:   RDW 11.4 (*)    All other components within normal limits  TROPONIN I (HIGH SENSITIVITY)  TROPONIN I (HIGH SENSITIVITY)    EKG EKG Interpretation  Date/Time:  Friday December 15 2018 21:33:08 EDT Ventricular Rate:  74 PR Interval:  134 QRS Duration: 94 QT Interval:  352 QTC Calculation: 390 R Axis:   87 Text Interpretation:  Sinus rhythm with marked sinus arrhythmia Otherwise normal ECG No old tracing to compare Confirmed by Merrily Pew [phone]) on 12/16/2018 5:11:13 AM   Radiology Dg Chest 2 View  Result Date: 12/15/2018 CLINICAL DATA:  Chest pain today. History of atrial fibrillation. EXAM: CHEST - 2 VIEW COMPARISON:  None. FINDINGS: The cardiomediastinal contours are normal. The lungs are clear. Pulmonary vasculature is normal. No consolidation, pleural effusion, or pneumothorax. No acute osseous abnormalities are seen. IMPRESSION: Negative radiographs of the chest. Electronically Signed   By: Keith Rake M.D.   On: 12/15/2018 22:31    Procedures Procedures (including critical care time)  Medications Ordered in ED Medications  alum & mag hydroxide-simeth (MAALOX/MYLANTA) 200-200-20 MG/5ML suspension 30 mL (30 mLs Oral Given 12/16/18 0540)     Initial  Impression / Assessment and Plan / ED Course  I have reviewed the triage vital signs and the nursing notes.  Pertinent labs & imaging results that were available during my care of the patient were reviewed by me and considered in my medical decision making (see chart for details).  Low suspicion for ACS, PE or other acute causes.  At this time I think is most likely be consistent with a GI cause.  We will treated for the same.  PCP follow-up.   Final Clinical Impressions(s) / ED Diagnoses   Final diagnoses:  Nonspecific chest pain    ED Discharge Orders  Ordered    pantoprazole (PROTONIX) 20 MG tablet  Daily     12/16/18 0531    famotidine (PEPCID) 20 MG tablet  2 times daily     12/16/18 0531           Giulliana Mcroberts, Barbara CowerJason, MD 12/16/18 2325

## 2018-12-16 NOTE — ED Notes (Signed)
Patient verbalizes understanding of discharge instructions. Opportunity for questioning and answers were provided. Armband removed by staff, pt discharged from ED.  

## 2018-12-18 DIAGNOSIS — F900 Attention-deficit hyperactivity disorder, predominantly inattentive type: Secondary | ICD-10-CM | POA: Diagnosis not present

## 2019-03-29 ENCOUNTER — Other Ambulatory Visit: Payer: Self-pay

## 2019-03-29 ENCOUNTER — Emergency Department (HOSPITAL_COMMUNITY)
Admission: EM | Admit: 2019-03-29 | Discharge: 2019-03-29 | Disposition: A | Discharge status: Home / Self Care | Payer: No Typology Code available for payment source | Attending: Emergency Medicine | Admitting: Emergency Medicine

## 2019-03-29 ENCOUNTER — Emergency Department (HOSPITAL_COMMUNITY): Payer: No Typology Code available for payment source

## 2019-03-29 DIAGNOSIS — Z7982 Long term (current) use of aspirin: Secondary | ICD-10-CM | POA: Diagnosis not present

## 2019-03-29 DIAGNOSIS — M545 Low back pain: Secondary | ICD-10-CM | POA: Insufficient documentation

## 2019-03-29 DIAGNOSIS — Y939 Activity, unspecified: Secondary | ICD-10-CM | POA: Insufficient documentation

## 2019-03-29 DIAGNOSIS — M25511 Pain in right shoulder: Secondary | ICD-10-CM | POA: Diagnosis not present

## 2019-03-29 DIAGNOSIS — Y92410 Unspecified street and highway as the place of occurrence of the external cause: Secondary | ICD-10-CM | POA: Insufficient documentation

## 2019-03-29 DIAGNOSIS — R519 Headache, unspecified: Secondary | ICD-10-CM | POA: Insufficient documentation

## 2019-03-29 DIAGNOSIS — Z79899 Other long term (current) drug therapy: Secondary | ICD-10-CM | POA: Insufficient documentation

## 2019-03-29 DIAGNOSIS — S0081XA Abrasion of other part of head, initial encounter: Secondary | ICD-10-CM | POA: Insufficient documentation

## 2019-03-29 DIAGNOSIS — S0990XA Unspecified injury of head, initial encounter: Secondary | ICD-10-CM | POA: Diagnosis present

## 2019-03-29 DIAGNOSIS — Y999 Unspecified external cause status: Secondary | ICD-10-CM | POA: Diagnosis not present
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
## 2019-03-29 NOTE — ED Notes (Signed)
An After Visit Summary was printed and given to the patient. Discharge instructions given and no further questions at this time. Pt ambulatory at discharge with no difficulty.  
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
## 2019-03-29 NOTE — Discharge Instructions (Addendum)
Use ibuprofen for pain.  Use ice for couple of days after that use heat.

## 2019-06-04 NOTE — Progress Notes (Deleted)
HPI: FU atrial fibrillation.Patient apparently was to give blood with [employer] and was found to have an irregular heartbeat. An electrocardiogram January 12, 2018 showed atrial fibrillation.  At last office visit patient was in sinus rhythm.  Echo ordered at last office visit but not performed. Since last seen   Current Outpatient Medications  Medication Sig Dispense Refill  . amphetamine-dextroamphetamine (ADDERALL XR) 10 MG 24 hr capsule Take 10 mg by mouth daily.    Marland Kitchen aspirin 81 MG tablet Take 1 tablet (81 mg total) by mouth daily.    . famotidine (PEPCID) 20 MG tablet Take 1 tablet (20 mg total) by mouth 2 (two) times daily. 20 tablet 0  . Melatonin 3 MG TABS Take 2 tablets by mouth at bedtime. As needed for sleep    . metoprolol succinate (TOPROL XL) 25 MG 24 hr tablet Take 1 tablet (25 mg total) by mouth daily. 30 tablet 3  . pantoprazole (PROTONIX) 20 MG tablet Take 1 tablet (20 mg total) by mouth daily. 10 tablet 0   No current facility-administered medications for this visit.     Past Medical History:  Diagnosis Date  . ADD (attention deficit disorder) without hyperactivity   . Atrial fibrillation, persistent   . Irregular heart beat   . Nicotine dependence   . Ocular migraine   . Pain in testicle   . Sleep disorder    shift work  . Testicular hypofunction     Past Surgical History:  Procedure Laterality Date  . none      Social History   Socioeconomic History  . Marital status: Single    Spouse name: Not on file  . Number of children: 1  . Years of education: Not on file  . Highest education level: Not on file  Occupational History  . Not on file  Tobacco Use  . Smoking status: Never Smoker  . Smokeless tobacco: Never Used  Substance and Sexual Activity  . Alcohol use: Yes    Comment: 6 glasses wine per week  . Drug use: Never  . Sexual activity: Not on file  Other Topics Concern  . Not on file  Social History Narrative  . Not on file   Social Determinants of Health   Financial Resource Strain:   . Difficulty of Paying Living Expenses: Not on file  Food Insecurity:   . Worried About Charity fundraiser in the Last Year: Not on file  . Ran Out of Food in the Last Year: Not on file  Transportation Needs:   . Lack of Transportation (Medical): Not on file  . Lack of Transportation (Non-Medical): Not on file  Physical Activity:   . Days of Exercise per Week: Not on file  . Minutes of Exercise per Session: Not on file  Stress:   . Feeling of Stress : Not on file  Social Connections:   . Frequency of Communication with Friends and Family: Not on file  . Frequency of Social Gatherings with Friends and Family: Not on file  . Attends Religious Services: Not on file  . Active Member of Clubs or Organizations: Not on file  . Attends Archivist Meetings: Not on file  . Marital Status: Not on file  Intimate Partner Violence:   . Fear of Current or Ex-Partner: Not on file  . Emotionally Abused: Not on file  . Physically Abused: Not on file  . Sexually Abused: Not on file  Family History  Problem Relation Age of Onset  . Hypertension Mother   . Hypertension Father   . Hypertension Maternal Grandmother   . Hypertension Maternal Grandfather     ROS: no fevers or chills, productive cough, hemoptysis, dysphasia, odynophagia, melena, hematochezia, dysuria, hematuria, rash, seizure activity, orthopnea, PND, pedal edema, claudication. Remaining systems are negative.  Physical Exam: Well-developed well-nourished in no acute distress.  Skin is warm and dry.  HEENT is normal.  Neck is supple.  Chest is clear to auscultation with normal expansion.  Cardiovascular exam is regular rate and rhythm.  Abdominal exam nontender or distended. No masses palpated. Extremities show no edema. neuro grossly intact  ECG- personally reviewed  A/P  1 paroxysmal atrial fibrillation-patient remains in sinus rhythm today.  We  ordered an echocardiogram at last office visit and we will reschedule.  Continue Toprol at present dose.  Continue aspirin 81 mg daily. CHADSvasc 0.   2 ADD-per primary care  3 H/O vaping-patient counseled on discontinuing.  Olga Millers, MD

## 2019-06-13 ENCOUNTER — Ambulatory Visit: Payer: Self-pay | Admitting: Cardiology

## 2019-07-16 ENCOUNTER — Encounter: Payer: Self-pay | Admitting: Cardiology

## 2019-07-24 ENCOUNTER — Telehealth (INDEPENDENT_AMBULATORY_CARE_PROVIDER_SITE_OTHER): Payer: Self-pay | Admitting: Cardiology

## 2019-07-24 ENCOUNTER — Encounter: Payer: Self-pay | Admitting: Cardiology

## 2019-07-24 VITALS — Ht 68.5 in | Wt 194.0 lb

## 2019-07-24 DIAGNOSIS — I480 Paroxysmal atrial fibrillation: Secondary | ICD-10-CM

## 2019-07-24 DIAGNOSIS — R072 Precordial pain: Secondary | ICD-10-CM

## 2019-07-24 MED ORDER — METOPROLOL SUCCINATE ER 25 MG PO TB24: 25.0000 mg | ORAL_TABLET | Freq: Every day | ORAL | 3 refills | Status: DC

## 2019-07-24 NOTE — Patient Instructions (Signed)
Medication Instructions:  START METOPROLOL SUCC ER 25 MG ONCE DAILY  *If you need a refill on your cardiac medications before your next appointment, please call your pharmacy*   Lab Work: Your physician recommends that you return for lab work WHEN CONVENIENT  If you have labs (blood work) drawn today and your tests are completely normal, you will receive your results only by: Marland Kitchen MyChart Message (if you have MyChart) OR . A paper copy in the mail If you have any lab test that is abnormal or we need to change your treatment, we will call you to review the results.   Testing/Procedures: Your physician has requested that you have an echocardiogram. Echocardiography is a painless test that uses sound waves to create images of your heart. It provides your doctor with information about the size and shape of your heart and how well your heart's chambers and valves are working. This procedure takes approximately one hour. There are no restrictions for this procedure.[address]   Follow-Up: At [facility], you and your health needs are our priority.  As part of our continuing mission to provide you with exceptional heart care, we have created designated Provider Care Teams.  These Care Teams include your primary Cardiologist (physician) and Advanced Practice Providers (APPs -  Physician Assistants and Nurse Practitioners) who all work together to provide you with the care you need, when you need it.  We recommend signing up for the patient portal called "MyChart".  Sign up information is provided on this After Visit Summary.  MyChart is used to connect with patients for Virtual Visits (Telemedicine).  Patients are able to view lab/test results, encounter notes, upcoming appointments, etc.  Non-urgent messages can be sent to your provider as well.   To learn more about what you can do with MyChart, go to [email or web address].    Your next appointment:   12 month(s)  The format for  your next appointment:   Either In Person or Virtual  Provider:   You may see Olga Millers MD or one of the following Advanced Practice Providers on your designated Care Team:    Corine Shelter, PA-C  [place], [place]  Edd Fabian, [place]

## 2019-07-24 NOTE — Progress Notes (Signed)
Virtual Visit via Video Note    This visit type was conducted due to national recommendations for restrictions regarding the COVID-19 Pandemic (e.g. social distancing) in an effort to limit this patient's exposure and mitigate transmission in our community.  Due to his co-morbid illnesses, this patient is at least at moderate risk for complications without adequate follow up.  This format is felt to be most appropriate for this patient at this time.  All issues noted in this document were discussed and addressed.  A limited physical exam was performed with this format.  Please refer to the patient's chart for his consent to telehealth for [facility].   Date:  07/24/2019   ID:  Roger Mitchell, DOB 02/01/1981, MRN [id number]  Patient Location:Home Provider Location: Home  PCP:  Lenis Noon, PA-C  Cardiologist:  Dr Stanford Breed  Evaluation Performed:  Follow-Up Visit  Chief Complaint:  FU atrial fibrillation  History of Present Illness:    FU atrial fibrillation.Patient apparently was to give blood with [facility] and was found to have an irregular heartbeat. An electrocardiogram January 12, 2018 showed atrial fibrillation.  At [facility] patient was in sinus rhythm.  Echo ordered at previous office visit but not performed.  TSH normal.  Since last seen patient states that 3 days ago he had chest pain for greater than 24 hours.  The pain was under his left breast without radiation.  It increased with lying in certain positions and improved with changing positions.  It also increased with inspiration.  He denies associated dyspnea or diaphoresis.  Minimal nausea.  Prior to that he denies exertional chest pain or dyspnea on exertion.  He did travel to [place] and back 1 week ago.  The patient does not have symptoms concerning for COVID-19 infection (fever, chills, cough, or new shortness of breath).    Past Medical History:  Diagnosis Date  . ADD (attention deficit disorder) without  hyperactivity   . Atrial fibrillation, persistent ([place])   . Irregular heart beat   . Nicotine dependence   . Ocular migraine   . Pain in testicle   . Sleep disorder    shift work  . Testicular hypofunction    Past Surgical History:  Procedure Laterality Date  . none       Current Meds  Medication Sig  . aspirin 81 MG tablet Take 1 tablet (81 mg total) by mouth daily.  . Melatonin 3 MG TABS Take 2 tablets by mouth at bedtime. As needed for sleep  . [DISCONTINUED] amphetamine-dextroamphetamine (ADDERALL XR) 10 MG 24 hr capsule Take 10 mg by mouth daily.  . [DISCONTINUED] famotidine (PEPCID) 20 MG tablet Take 1 tablet (20 mg total) by mouth 2 (two) times daily.  . [DISCONTINUED] metoprolol succinate (TOPROL XL) 25 MG 24 hr tablet Take 1 tablet (25 mg total) by mouth daily.     Allergies:   Patient has no known allergies.   Social History   Tobacco Use  . Smoking status: Never Smoker  . Smokeless tobacco: Never Used  Substance Use Topics  . Alcohol use: Yes    Comment: 6 glasses wine per week  . Drug use: Never     Family Hx: The patient's family history includes Hypertension in his father, maternal grandfather, maternal grandmother, and mother.  ROS:   Please see the history of present illness.    No Fever, chills  or productive cough All other systems reviewed and are negative.   Recent Labs: 12/15/2018:  BUN 15; Creatinine, Ser 1.27; Hemoglobin 16.6; Platelets 248; Potassium 4.0; Sodium 138   Recent Lipid Panel Lab Results  Component Value Date/Time   CHOL 157 08/04/2006 08:40 PM   TRIG 59 08/04/2006 08:40 PM   HDL 55 08/04/2006 08:40 PM   CHOLHDL 2.9 Ratio 08/04/2006 08:40 PM   LDLCALC 90 08/04/2006 08:40 PM    Wt Readings from Last 3 Encounters:  07/24/19 194 lb (88 kg)  12/15/18 184 lb (83.5 kg)  05/29/18 185 lb (83.9 kg)     Objective:    Vital Signs:  Ht 5' 8.5" (1.74 m)   Wt 194 lb (88 kg)   BMI 29.07 kg/m    VITAL SIGNS:   reviewed NAD Answers questions appropriately Normal affect Remainder of physical examination not performed (telehealth visit; coronavirus pandemic)  ASSESSMENT & PLAN:    1. Paroxysmal atrial fibrillation-based on history patient remains in sinus rhythm.  I again asked him to have an echocardiogram performed to evaluate LV function. CHADSvasc 0.  Continue aspirin; patient discontinued Toprol on his own.  Will resume at 25 mg daily. 2. Chest pain-symptoms atypical and possibly musculoskeletal.  They have been continuous for 24 hours and increased with changing positions and inspiration.  Given recent travel to [place] I will arrange a D-dimer.  If elevated will arrange CTA to exclude pulmonary embolus.  Await echocardiogram for wall motion.  If normal will not pursue further ischemia evaluation. 3. Vaping-patient counseled on discontinuing. 4. ADD-Per primary care.  COVID-19 Education: The importance of social distancing was discussed today.  Time:   Today, I have spent 12 minutes with the patient with telehealth technology discussing the above problems.     Medication Adjustments/Labs and Tests Ordered: Current medicines are reviewed at length with the patient today.  Concerns regarding medicines are outlined above.   Tests Ordered: No orders of the defined types were placed in this encounter.   Medication Changes: No orders of the defined types were placed in this encounter.   Follow Up:  Either In Person or Virtual in 1 year(s)  Signed, Olga Millers, MD  07/24/2019 8:17 AM    [facility]

## 2019-07-31 ENCOUNTER — Other Ambulatory Visit: Payer: Self-pay

## 2019-07-31 LAB — D-DIMER, QUANTITATIVE: D-DIMER: <0.2 mg/L FEU (ref 0.00–0.49)

## 2019-08-07 ENCOUNTER — Other Ambulatory Visit (HOSPITAL_COMMUNITY): Payer: Self-pay

## 2019-08-21 ENCOUNTER — Other Ambulatory Visit (HOSPITAL_COMMUNITY): Payer: Self-pay

## 2019-09-05 ENCOUNTER — Encounter (HOSPITAL_BASED_OUTPATIENT_CLINIC_OR_DEPARTMENT_OTHER): Payer: Self-pay | Admitting: *Deleted

## 2019-09-05 ENCOUNTER — Emergency Department (HOSPITAL_BASED_OUTPATIENT_CLINIC_OR_DEPARTMENT_OTHER)
Admission: EM | Admit: 2019-09-05 | Discharge: 2019-09-05 | Disposition: A | Discharge status: Home / Self Care | Payer: Self-pay | Attending: Emergency Medicine | Admitting: Emergency Medicine

## 2019-09-05 ENCOUNTER — Other Ambulatory Visit: Payer: Self-pay

## 2019-09-05 DIAGNOSIS — Z7982 Long term (current) use of aspirin: Secondary | ICD-10-CM | POA: Insufficient documentation

## 2019-09-05 DIAGNOSIS — M25512 Pain in left shoulder: Secondary | ICD-10-CM | POA: Insufficient documentation

## 2019-09-05 DIAGNOSIS — R519 Headache, unspecified: Secondary | ICD-10-CM

## 2019-09-05 DIAGNOSIS — G44209 Tension-type headache, unspecified, not intractable: Secondary | ICD-10-CM | POA: Insufficient documentation

## 2019-09-05 DIAGNOSIS — Z79899 Other long term (current) drug therapy: Secondary | ICD-10-CM | POA: Insufficient documentation

## 2019-09-05 DIAGNOSIS — I4891 Unspecified atrial fibrillation: Secondary | ICD-10-CM | POA: Insufficient documentation

## 2019-09-05 MED ORDER — METHOCARBAMOL 500 MG PO TABS: 500.0000 mg | ORAL_TABLET | Freq: Two times a day (BID) | ORAL | 0 refills | Status: DC
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
## 2019-09-05 NOTE — ED Notes (Signed)
Provider at bedside

## 2019-09-05 NOTE — Discharge Instructions (Signed)
Take NSAIDs or Tylenol as needed for the next week. Take this medicine with food. Take muscle relaxer at bedtime to help you sleep. This medicine makes you drowsy so do not take before driving or work Use a heating pad for sore muscles - use for 20 minutes several times a day Try gentle range of motion exercises Return for worsening symptoms  
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
## 2019-09-06 ENCOUNTER — Ambulatory Visit (HOSPITAL_COMMUNITY): Payer: Self-pay | Attending: Cardiovascular Disease

## 2019-09-06 DIAGNOSIS — I480 Paroxysmal atrial fibrillation: Secondary | ICD-10-CM

## 2020-01-05 ENCOUNTER — Other Ambulatory Visit: Payer: Self-pay

## 2020-01-05 ENCOUNTER — Emergency Department (HOSPITAL_BASED_OUTPATIENT_CLINIC_OR_DEPARTMENT_OTHER)
Admission: EM | Admit: 2020-01-05 | Discharge: 2020-01-05 | Disposition: A | Discharge status: Home / Self Care | Payer: Self-pay | Attending: Emergency Medicine | Admitting: Emergency Medicine

## 2020-01-05 ENCOUNTER — Encounter (HOSPITAL_BASED_OUTPATIENT_CLINIC_OR_DEPARTMENT_OTHER): Payer: Self-pay | Admitting: Emergency Medicine

## 2020-01-05 DIAGNOSIS — Z5321 Procedure and treatment not carried out due to patient leaving prior to being seen by health care provider: Secondary | ICD-10-CM | POA: Insufficient documentation

## 2020-01-05 DIAGNOSIS — R1013 Epigastric pain: Secondary | ICD-10-CM | POA: Insufficient documentation

## 2020-01-05 LAB — CBC WITH DIFFERENTIAL/PLATELET
Abs Immature Granulocytes: 0.05 10*3/uL (ref 0.00–0.07)
Basophils Absolute: 0.1 10*3/uL (ref 0.0–0.1)
Basophils Relative: 1 %
Eosinophils Absolute: 0.8 10*3/uL — ABNORMAL HIGH (ref 0.0–0.5)
Eosinophils Relative: 11 %
HCT: 44.4 % (ref 39.0–52.0)
Hemoglobin: 15.8 g/dL (ref 13.0–17.0)
Immature Granulocytes: 1 %
Lymphocytes Relative: 37 %
Lymphs Abs: 2.7 10*3/uL (ref 0.7–4.0)
MCH: 31.7 pg (ref 26.0–34.0)
MCHC: 35.6 g/dL (ref 30.0–36.0)
MCV: 89.2 fL (ref 80.0–100.0)
Monocytes Absolute: 0.8 10*3/uL (ref 0.1–1.0)
Monocytes Relative: 10 %
Neutro Abs: 2.9 10*3/uL (ref 1.7–7.7)
Neutrophils Relative %: 40 %
Platelets: 245 10*3/uL (ref 150–400)
RBC: 4.98 MIL/uL (ref 4.22–5.81)
RDW: 11.9 % (ref 11.5–15.5)
WBC: 7.3 10*3/uL (ref 4.0–10.5)
nRBC: 0 % (ref 0.0–0.2)

## 2020-01-05 LAB — COMPREHENSIVE METABOLIC PANEL
ALT: 198 U/L — ABNORMAL HIGH (ref 0–44)
AST: 67 U/L — ABNORMAL HIGH (ref 15–41)
Albumin: 4.1 g/dL (ref 3.5–5.0)
Alkaline Phosphatase: 80 U/L (ref 38–126)
Anion gap: 10 (ref 5–15)
BUN: 16 mg/dL (ref 6–20)
CO2: 24 mmol/L (ref 22–32)
Calcium: 9.2 mg/dL (ref 8.9–10.3)
Chloride: 104 mmol/L (ref 98–111)
Creatinine, Ser: 1.13 mg/dL (ref 0.61–1.24)
GFR calc Af Amer: >60 mL/min (ref >60)
GFR calc non Af Amer: >60 mL/min (ref >60)
Glucose, Bld: 105 mg/dL — ABNORMAL HIGH (ref 70–99)
Potassium: 4 mmol/L (ref 3.5–5.1)
Sodium: 138 mmol/L (ref 135–145)
Total Bilirubin: 0.5 mg/dL (ref 0.3–1.2)
Total Protein: 7.3 g/dL (ref 6.5–8.1)

## 2020-01-05 LAB — TROPONIN I (HIGH SENSITIVITY): Troponin I (High Sensitivity): 3 ng/L (ref <18)

## 2020-01-05 NOTE — ED Triage Notes (Signed)
Patient presents with complaints of epigastric pain; states onset 40 minutes ago; complains of burning sensation to epigastric area; states also felt like he was having an anxiety attack at onset of pain; states anxiety attack sx have resolved.

## 2020-01-30 ENCOUNTER — Other Ambulatory Visit: Payer: Self-pay

## 2020-01-31 ENCOUNTER — Ambulatory Visit: Payer: Self-pay | Admitting: Family Medicine

## 2020-02-14 ENCOUNTER — Encounter (HOSPITAL_BASED_OUTPATIENT_CLINIC_OR_DEPARTMENT_OTHER): Payer: Self-pay | Admitting: *Deleted

## 2020-02-14 ENCOUNTER — Other Ambulatory Visit: Payer: Self-pay

## 2020-02-14 ENCOUNTER — Emergency Department (HOSPITAL_BASED_OUTPATIENT_CLINIC_OR_DEPARTMENT_OTHER)
Admission: EM | Admit: 2020-02-14 | Discharge: 2020-02-14 | Disposition: A | Discharge status: Home / Self Care | Payer: Self-pay | Attending: Emergency Medicine | Admitting: Emergency Medicine

## 2020-02-14 DIAGNOSIS — R55 Syncope and collapse: Secondary | ICD-10-CM | POA: Insufficient documentation

## 2020-02-14 DIAGNOSIS — R7989 Other specified abnormal findings of blood chemistry: Secondary | ICD-10-CM

## 2020-02-14 DIAGNOSIS — Z7982 Long term (current) use of aspirin: Secondary | ICD-10-CM | POA: Insufficient documentation

## 2020-02-14 DIAGNOSIS — Z79899 Other long term (current) drug therapy: Secondary | ICD-10-CM | POA: Insufficient documentation

## 2020-02-14 DIAGNOSIS — E860 Dehydration: Secondary | ICD-10-CM | POA: Insufficient documentation

## 2020-02-14 LAB — BASIC METABOLIC PANEL
Anion gap: 13 (ref 5–15)
BUN: 13 mg/dL (ref 6–20)
CO2: 25 mmol/L (ref 22–32)
Calcium: 9.4 mg/dL (ref 8.9–10.3)
Chloride: 96 mmol/L — ABNORMAL LOW (ref 98–111)
Creatinine, Ser: 1.4 mg/dL — ABNORMAL HIGH (ref 0.61–1.24)
GFR, Estimated: >60 mL/min (ref >60)
Glucose, Bld: 131 mg/dL — ABNORMAL HIGH (ref 70–99)
Potassium: 3.7 mmol/L (ref 3.5–5.1)
Sodium: 134 mmol/L — ABNORMAL LOW (ref 135–145)

## 2020-02-14 LAB — CBC WITH DIFFERENTIAL/PLATELET
Abs Immature Granulocytes: 0.03 10*3/uL (ref 0.00–0.07)
Basophils Absolute: 0.1 10*3/uL (ref 0.0–0.1)
Basophils Relative: 2 %
Eosinophils Absolute: 0.4 10*3/uL (ref 0.0–0.5)
Eosinophils Relative: 7 %
HCT: 43 % (ref 39.0–52.0)
Hemoglobin: 15 g/dL (ref 13.0–17.0)
Immature Granulocytes: 1 %
Lymphocytes Relative: 42 %
Lymphs Abs: 2.7 10*3/uL (ref 0.7–4.0)
MCH: 31.9 pg (ref 26.0–34.0)
MCHC: 34.9 g/dL (ref 30.0–36.0)
MCV: 91.5 fL (ref 80.0–100.0)
Monocytes Absolute: 0.6 10*3/uL (ref 0.1–1.0)
Monocytes Relative: 9 %
Neutro Abs: 2.5 10*3/uL (ref 1.7–7.7)
Neutrophils Relative %: 39 %
Platelets: 214 10*3/uL (ref 150–400)
RBC: 4.7 MIL/uL (ref 4.22–5.81)
RDW: 11.9 % (ref 11.5–15.5)
WBC: 6.3 10*3/uL (ref 4.0–10.5)
nRBC: 0 % (ref 0.0–0.2)

## 2020-02-14 LAB — CBG MONITORING, ED: Glucose-Capillary: 144 mg/dL — ABNORMAL HIGH (ref 70–99)

## 2020-02-14 MED ORDER — SODIUM CHLORIDE 0.9 % IV BOLUS
1000.0000 mL | Freq: Once | INTRAVENOUS | Status: AC
  Administered 2020-02-14: 1000 mL via INTRAVENOUS

## 2020-02-14 NOTE — ED Provider Notes (Signed)
[facility] EMERGENCY DEPARTMENT Provider Note   CSN: [id number] Arrival date & time: 02/14/20  2025     History Chief Complaint  Patient presents with  . Dizziness    Roger Mitchell is a 39 y.o. male with PMHx A fib (rate controlled on metoprolol) who presents to the ED today with complaint of sudden onset lightheadedness/near syncope that occurred earlier today around 5 PM. Pt reports he was on his way to the grocery store when he felt like he was going to pass out. He sat down for awhile to steady himself and then went to the grocery store to get a large bottle of water and an apple assuming he may be dehydrated. Pt states that afterwards he had an appt at urgent care to follow up on recurrent bacterial conjunctivitis. Pt states that he mentioned to them his feelings of near syncope and they checked his BP which was mildly elevated in the 150s systolic. Pt states that he was feeling a little bit better and went home. He states on his way home he had the same feeling while driving and got concerned. He called his girlfriend and decided to come to the ED for further evaluation. Pt states this morning he woke up feeling fine. He does report he did not eat much throughout the day - he states he ate a slice of pizza around noon, had 1/2 of a 5 hour energy, some water with his multi vitamin and then the bottle of water and an apple around 5 PM. He states he does typically eat more than this however had a busy day. Pt states he still feels mildly lightheaded. His CC is dizziness however he denies room spinning dizziness. Denies fevers, chills, chest pain, shortness of breath, blurry vision, double vision, nausea, vomiting, confusion, unilateral weakness or numbness, speech changes, thunderclap headache, or any other associated symptoms.   The history is provided by the patient and medical records.       Past Medical History:  Diagnosis Date  . ADD (attention deficit disorder) without  hyperactivity   . Atrial fibrillation, persistent ([facility])   . Irregular heart beat   . Nicotine dependence   . Ocular migraine   . Pain in testicle   . Sleep disorder    shift work  . Testicular hypofunction     Patient Active Problem List   Diagnosis Date Noted  . Adult attention deficit disorder 10/20/2010  . TESTICULAR HYPOFUNCTION 03/03/2009  . BREAST MASS, LEFT 08/04/2006  . SYMPTOM, POLYURIA 08/04/2006  . PATELLO FEMORAL STRESS SYNDROME 06/23/2006    Past Surgical History:  Procedure Laterality Date  . none         Family History  Problem Relation Age of Onset  . Hypertension Mother   . Hypertension Father   . Hypertension Maternal Grandmother   . Hypertension Maternal Grandfather     Social History   Tobacco Use  . Smoking status: Never Smoker  . Smokeless tobacco: Never Used  Vaping Use  . Vaping Use: Every day  Substance Use Topics  . Alcohol use: Yes    Comment: 6 glasses wine per week  . Drug use: Never    Home Medications Prior to Admission medications   Medication Sig Start Date End Date Taking? Authorizing Provider  aspirin 81 MG tablet Take 1 tablet (81 mg total) by mouth daily. 01/25/18   Lewayne Bunting, MD  Melatonin 3 MG TABS Take 2 tablets by mouth at bedtime. As  needed for sleep    [provider]  methocarbamol (ROBAXIN) 500 MG tablet Take 1 tablet (500 mg total) by mouth 2 (two) times daily. 09/05/19   Bethel Born, PA-C  metoprolol succinate (TOPROL XL) 25 MG 24 hr tablet Take 1 tablet (25 mg total) by mouth daily. 07/24/19   Lewayne Bunting, MD    Allergies    Patient has no known allergies.  Review of Systems   Review of Systems  Constitutional: Negative for chills and fever.  Eyes: Negative for visual disturbance.  Respiratory: Negative for shortness of breath.   Cardiovascular: Negative for chest pain.  Gastrointestinal: Negative for nausea and vomiting.  Neurological: Positive for light-headedness. Negative  for dizziness, syncope and facial asymmetry.  All other systems reviewed and are negative.   Physical Exam Updated Vital Signs BP (!) 131/100   Pulse 70   Temp 99.2 F (37.3 C) (Oral)   Resp 17   Ht 5\' 8"  (1.727 m)   Wt 87.5 kg   SpO2 99%   BMI 29.35 kg/m   Physical Exam Vitals and nursing note reviewed.  Constitutional:      Appearance: He is not ill-appearing or diaphoretic.  HENT:     Head: Normocephalic and atraumatic.  Eyes:     Extraocular Movements: Extraocular movements intact.     Conjunctiva/sclera: Conjunctivae normal.     Pupils: Pupils are equal, round, and reactive to light.  Cardiovascular:     Rate and Rhythm: Normal rate and regular rhythm.     Pulses: Normal pulses.  Pulmonary:     Effort: Pulmonary effort is normal.     Breath sounds: Normal breath sounds. No wheezing, rhonchi or rales.  Abdominal:     Palpations: Abdomen is soft.     Tenderness: There is no abdominal tenderness. There is no guarding or rebound.  Musculoskeletal:     Cervical back: Neck supple.  Skin:    General: Skin is warm and dry.  Neurological:     Mental Status: He is alert.     Comments: CN 3-12 grossly intact A&O x4 GCS 15 Sensation and strength intact Gait nonataxic including with tandem walking Coordination with finger-to-nose WNL Neg romberg, neg pronator drift     ED Results / Procedures / Treatments   Labs (all labs ordered are listed, but only abnormal results are displayed) Labs Reviewed  BASIC METABOLIC PANEL - Abnormal; Notable for the following components:      Result Value   Sodium 134 (*)    Chloride 96 (*)    Glucose, Bld 131 (*)    Creatinine, Ser 1.40 (*)    All other components within normal limits  CBG MONITORING, ED - Abnormal; Notable for the following components:   Glucose-Capillary 144 (*)    All other components within normal limits  CBC WITH DIFFERENTIAL/PLATELET    EKG EKG Interpretation  Date/Time:  Thursday February 14 2020  20:39:25 EDT Ventricular Rate:  72 PR Interval:    QRS Duration: 103 QT Interval:  378 QTC Calculation: 414 R Axis:   82 Text Interpretation: Sinus rhythm Atrial premature complex Abnormal R-wave progression, early transition No significant change since last tracing Confirmed by [id number] (Gwyneth Sprout) on 02/14/2020 9:11:50 PM   Radiology No results found.  Procedures Procedures (including critical care time)  Medications Ordered in ED Medications  sodium chloride 0.9 % bolus 1,000 mL (1,000 mLs Intravenous New Bag/Given 02/14/20 2224)    ED Course  I have reviewed  the triage vital signs and the nursing notes.  Pertinent labs & imaging results that were available during my care of the patient were reviewed by me and considered in my medical decision making (see chart for details).    MDM Rules/Calculators/A&P                          39 year old male who presents to the ED today with complaints of lightheadedness/near syncope that occurred around 5 PM today and has been persistent throughout.  Patient had a follow-up visit at an urgent care for bacterial conjunctivitis today and mentioned the lightheadedness, his blood pressure was elevated at that time. Attempted to go home however began feeling lightheaded again while driving prompting him to come to the ED. On arrival to the ED patient's temp is 99.2. He is nontachycardic and nontachypneic and appears to be in no acute distress. His blood pressure is 136/105. I personally visualized patient ambulating to the bathroom with steady gait. On exam patient has no focal neuro deficits on exam today. No nystagmus. He denies any room spinning dizziness and I am less likely to think that this is vertigo or BPPV. He does endorse that he did not eat or drink much today., I very much suspect dehydration at this time. Will obtain orthostatic vital signs and reassess. Will obtain lab work including CBC and CMP. An EKG was done while patient was  in the waiting room without any obvious acute findings today. Appears unchanged from previous. CBG was obtained as well with glucose 144.  Orthostatic Lying  BP- Lying:119/88 Pulse- Lying:64 Orthostatic Sitting BP- Sitting:146/107 (!) Pulse- Sitting:86 Orthostatic Standing at 0 minutes BP- Standing at 0 minutes:110/85 Pulse- Standing at 0 minutes:75 Orthostatic Standing at 3 minutes BP- Standing at 3 minutes:124/97 (!) Pulse- Standing at 3 minutes:73  CBC without leukocytosis. Hgb stable at 15.0 BMP with sodium 134, chloride 96. Glucose 131. Creatinine 1.40; mildly elevated from previous  Lab Results  Component Value Date   CREATININE 1.40 (H) 02/14/2020   CREATININE 1.13 01/05/2020   CREATININE 1.27 (H) 12/15/2018   Given elevated creatinine and orthostatics will provide IV fluids at this time. I suspect this is causing of pt's near syncope today.   Pt updated on findings. Fluids are running and he reports he is already starting to feel better. Do not feel he needs additional work up at this time. Have encouraged to increase his water intake and to avoid caffeine for the next week and to have his kidney function rechecked with his PCP. He does not currently have one; info given for [facility]. Pt requesting other PCPs in the area even if they require a copay; have provided. Strict return precautions discussed with pt. He is in agreement with plan and stable for discharge home.   This note was prepared using Dragon voice recognition software and may include unintentional dictation errors due to the inherent limitations of voice recognition software.   Final Clinical Impression(s) / ED Diagnoses Final diagnoses:  Near syncope  Dehydration  Elevated serum creatinine    Rx / DC Orders ED Discharge Orders    None       Discharge Instructions     Your labwork indicated some dehydration. We have provided you with IV fluids in the ED. It is recommended that  you increase your water intake for the next week and have your PCP recheck your kidney function. Refrain from liquids with caffeine  in them as this can dehydrate you further.   Attached is information for [facility] who accepts patients without insurance. There is also a list of some other primary care offices in [place] but they may require a co-pay  Return to the ED IMMEDIATELY for any worsening symptoms including passing out, vision changes, chest pain, shortness of breath, severe headache, weakness or numbness on one side of your body, or any other new/concerning symptoms.        Tanda RockersVenter, Mariellen Blaney, PA-C 02/18/20 [id number]    Gwyneth SproutPlunkett, [place], MD 02/18/20 0800

## 2020-02-14 NOTE — ED Triage Notes (Addendum)
C/o dizziness  X 3 hrs hx afib

## 2020-02-14 NOTE — Discharge Instructions (Addendum)
Your labwork indicated some dehydration. We have provided you with IV fluids in the ED. It is recommended that you increase your water intake for the next week and have your PCP recheck your kidney function. Refrain from liquids with caffeine in them as this can dehydrate you further.   Attached is information for [facility] who accepts patients without insurance. There is also a list of some other primary care offices in [place] but they may require a co-pay  Return to the ED IMMEDIATELY for any worsening symptoms including passing out, vision changes, chest pain, shortness of breath, severe headache, weakness or numbness on one side of your body, or any other new/concerning symptoms.

## 2020-02-14 NOTE — ED Notes (Signed)
Pt reporting fatigue, feeling as if he has to pass out. BEFAST negative, ambulatory at a steady gait and drove self to ED. States hasn't eaten much today - one slice of pizza and an apple.

## 2020-03-15 ENCOUNTER — Emergency Department (HOSPITAL_BASED_OUTPATIENT_CLINIC_OR_DEPARTMENT_OTHER)
Admission: EM | Admit: 2020-03-15 | Discharge: 2020-03-15 | Disposition: A | Discharge status: Home / Self Care | Payer: Self-pay | Attending: Emergency Medicine | Admitting: Emergency Medicine

## 2020-03-15 ENCOUNTER — Encounter (HOSPITAL_BASED_OUTPATIENT_CLINIC_OR_DEPARTMENT_OTHER): Payer: Self-pay | Admitting: Emergency Medicine

## 2020-03-15 ENCOUNTER — Emergency Department (HOSPITAL_BASED_OUTPATIENT_CLINIC_OR_DEPARTMENT_OTHER): Payer: Self-pay

## 2020-03-15 ENCOUNTER — Other Ambulatory Visit: Payer: Self-pay

## 2020-03-15 DIAGNOSIS — Z79899 Other long term (current) drug therapy: Secondary | ICD-10-CM | POA: Insufficient documentation

## 2020-03-15 DIAGNOSIS — Z7982 Long term (current) use of aspirin: Secondary | ICD-10-CM | POA: Insufficient documentation

## 2020-03-15 DIAGNOSIS — R072 Precordial pain: Secondary | ICD-10-CM | POA: Insufficient documentation

## 2020-03-15 DIAGNOSIS — I4811 Longstanding persistent atrial fibrillation: Secondary | ICD-10-CM | POA: Insufficient documentation

## 2020-03-15 LAB — CBC WITH DIFFERENTIAL/PLATELET
Abs Immature Granulocytes: 0.02 10*3/uL (ref 0.00–0.07)
Basophils Absolute: 0.1 10*3/uL (ref 0.0–0.1)
Basophils Relative: 2 %
Eosinophils Absolute: 0.5 10*3/uL (ref 0.0–0.5)
Eosinophils Relative: 8 %
HCT: 43.2 % (ref 39.0–52.0)
Hemoglobin: 15.2 g/dL (ref 13.0–17.0)
Immature Granulocytes: 0 %
Lymphocytes Relative: 37 %
Lymphs Abs: 2.4 10*3/uL (ref 0.7–4.0)
MCH: 31.6 pg (ref 26.0–34.0)
MCHC: 35.2 g/dL (ref 30.0–36.0)
MCV: 89.8 fL (ref 80.0–100.0)
Monocytes Absolute: 0.7 10*3/uL (ref 0.1–1.0)
Monocytes Relative: 11 %
Neutro Abs: 2.7 10*3/uL (ref 1.7–7.7)
Neutrophils Relative %: 42 %
Platelets: 238 10*3/uL (ref 150–400)
RBC: 4.81 MIL/uL (ref 4.22–5.81)
RDW: 11.7 % (ref 11.5–15.5)
WBC: 6.4 10*3/uL (ref 4.0–10.5)
nRBC: 0 % (ref 0.0–0.2)

## 2020-03-15 LAB — BASIC METABOLIC PANEL
Anion gap: 10 (ref 5–15)
BUN: 9 mg/dL (ref 6–20)
CO2: 25 mmol/L (ref 22–32)
Calcium: 8.9 mg/dL (ref 8.9–10.3)
Chloride: 101 mmol/L (ref 98–111)
Creatinine, Ser: 1.13 mg/dL (ref 0.61–1.24)
GFR, Estimated: >60 mL/min (ref >60)
Glucose, Bld: 99 mg/dL (ref 70–99)
Potassium: 3.9 mmol/L (ref 3.5–5.1)
Sodium: 136 mmol/L (ref 135–145)

## 2020-03-15 LAB — TROPONIN I (HIGH SENSITIVITY)
Troponin I (High Sensitivity): 2 ng/L (ref <18)
Troponin I (High Sensitivity): <2 ng/L (ref <18)

## 2020-03-15 MED ORDER — ALUM & MAG HYDROXIDE-SIMETH 200-200-20 MG/5ML PO SUSP
30.0000 mL | Freq: Once | ORAL | Status: AC
  Administered 2020-03-15: 30 mL via ORAL
  Filled 2020-03-15: qty 30

## 2020-03-15 MED ORDER — OMEPRAZOLE 20 MG PO CPDR: 20.0000 mg | DELAYED_RELEASE_CAPSULE | Freq: Every day | ORAL | 0 refills | Status: DC

## 2020-03-15 MED ORDER — KETOROLAC TROMETHAMINE 60 MG/2ML IM SOLN
60.0000 mg | Freq: Once | INTRAMUSCULAR | Status: AC
  Administered 2020-03-15: 60 mg via INTRAMUSCULAR
  Filled 2020-03-15: qty 2

## 2020-03-15 NOTE — ED Provider Notes (Signed)
[facility] EMERGENCY DEPARTMENT Provider Note   CSN: [id number] Arrival date & time: 03/15/20  0344     History Chief Complaint  Patient presents with  . Chest Pain    Midsternal CP radiates to left jaw started yesterday, has hx of Afib, nausea and SOB     Roger Mitchell is a 39 y.o. male.  The history is provided by the patient.  Chest Pain Pain location:  Substernal area Pain quality: burning   Pain radiates to:  Neck (bilateral) Pain severity:  Moderate Onset quality:  Gradual Timing:  Constant Progression:  Unchanged Chronicity:  Recurrent Context: at rest   Relieved by:  Nothing Worsened by:  Nothing Ineffective treatments:  None tried Associated symptoms: no abdominal pain, no altered mental status, no anorexia, no back pain, no claudication, no cough, no diaphoresis, no dizziness, no fatigue, no fever, no lower extremity edema, no nausea, no numbness, no orthopnea, no palpitations, no PND, no shortness of breath, no syncope, no vomiting and no weakness   Risk factors: male sex   Patient presents with SSCP for several hours at rest.  No SOB, no DOE.  No exertional symptoms.  No n/v/d.  No travel.  No leg pain or swelling.       Past Medical History:  Diagnosis Date  . ADD (attention deficit disorder) without hyperactivity   . Atrial fibrillation, persistent (HCC)   . Irregular heart beat   . Nicotine dependence   . Ocular migraine   . Pain in testicle   . Sleep disorder    shift work  . Testicular hypofunction     Patient Active Problem List   Diagnosis Date Noted  . Adult attention deficit disorder 10/20/2010  . TESTICULAR HYPOFUNCTION 03/03/2009  . BREAST MASS, LEFT 08/04/2006  . SYMPTOM, POLYURIA 08/04/2006  . PATELLO FEMORAL STRESS SYNDROME 06/23/2006    Past Surgical History:  Procedure Laterality Date  . none         Family History  Problem Relation Age of Onset  . Hypertension Mother   . Hypertension Father   .  Hypertension Maternal Grandmother   . Hypertension Maternal Grandfather     Social History   Tobacco Use  . Smoking status: Never Smoker  . Smokeless tobacco: Never Used  Vaping Use  . Vaping Use: Every day  Substance Use Topics  . Alcohol use: Yes    Comment: 6 glasses wine per week  . Drug use: Never    Home Medications Prior to Admission medications   Medication Sig Start Date End Date Taking? Authorizing Provider  aspirin 81 MG tablet Take 1 tablet (81 mg total) by mouth daily. 01/25/18   Lewayne Bunting, MD  Melatonin 3 MG TABS Take 2 tablets by mouth at bedtime. As needed for sleep    [provider]  methocarbamol (ROBAXIN) 500 MG tablet Take 1 tablet (500 mg total) by mouth 2 (two) times daily. 09/05/19   Bethel Born, PA-C  metoprolol succinate (TOPROL XL) 25 MG 24 hr tablet Take 1 tablet (25 mg total) by mouth daily. 07/24/19   Lewayne Bunting, MD  omeprazole (PRILOSEC) 20 MG capsule Take 1 capsule (20 mg total) by mouth daily. 03/15/20   Jocelynn Gioffre, MD    Allergies    Patient has no known allergies.  Review of Systems   Review of Systems  Constitutional: Negative for diaphoresis, fatigue and fever.  HENT: Negative for congestion.   Eyes: Negative for visual  disturbance.  Respiratory: Negative for cough and shortness of breath.   Cardiovascular: Positive for chest pain. Negative for palpitations, orthopnea, claudication, leg swelling, syncope and PND.  Gastrointestinal: Negative for abdominal pain, anorexia, nausea and vomiting.  Genitourinary: Negative for difficulty urinating.  Musculoskeletal: Negative for back pain.  Skin: Negative for rash.  Neurological: Negative for dizziness, weakness and numbness.  Psychiatric/Behavioral: Negative for agitation.  All other systems reviewed and are negative.   Physical Exam Updated Vital Signs BP 127/73   Pulse 68   Temp 98.3 F (36.8 C) (Oral)   Resp 16   Ht 5\' 8"  (1.727 m)   Wt 86.2 kg    SpO2 98%   BMI 28.89 kg/m   Physical Exam Vitals and nursing note reviewed.  Constitutional:      General: He is not in acute distress.    Appearance: Normal appearance. He is not diaphoretic.  HENT:     Head: Normocephalic and atraumatic.     Nose: Nose normal.  Eyes:     Conjunctiva/sclera: Conjunctivae normal.     Pupils: Pupils are equal, round, and reactive to light.  Cardiovascular:     Rate and Rhythm: Normal rate and regular rhythm.     Pulses: Normal pulses.     Heart sounds: Normal heart sounds.  Pulmonary:     Effort: Pulmonary effort is normal.     Breath sounds: Normal breath sounds.  Abdominal:     General: Abdomen is flat.     Palpations: Abdomen is soft.     Tenderness: There is no abdominal tenderness. There is no guarding or rebound.     Comments: Hyperactive BS in the epigastrum   Musculoskeletal:        General: No tenderness. Normal range of motion.     Cervical back: Normal range of motion and neck supple.     Right lower leg: No edema.     Left lower leg: No edema.  Skin:    General: Skin is warm and dry.     Capillary Refill: Capillary refill takes less than 2 seconds.  Neurological:     Mental Status: He is alert and oriented to person, place, and time.     Deep Tendon Reflexes: Reflexes normal.  Psychiatric:        Mood and Affect: Mood normal.        Behavior: Behavior normal.     ED Results / Procedures / Treatments   Labs (all labs ordered are listed, but only abnormal results are displayed) Results for orders placed or performed during the hospital encounter of 03/15/20  CBC with Differential/Platelet  Result Value Ref Range   WBC 6.4 4.0 - 10.5 K/uL   RBC 4.81 4.22 - 5.81 MIL/uL   Hemoglobin 15.2 13.0 - 17.0 g/dL   HCT 03/17/20 39 - 52 %   MCV 89.8 80.0 - 100.0 fL   MCH 31.6 26.0 - 34.0 pg   MCHC 35.2 30.0 - 36.0 g/dL   RDW 29.5 18.8 - 41.6 %   Platelets 238 150 - 400 K/uL   nRBC 0.0 0.0 - 0.2 %   Neutrophils Relative % 42 %    Neutro Abs 2.7 1.7 - 7.7 K/uL   Lymphocytes Relative 37 %   Lymphs Abs 2.4 0.7 - 4.0 K/uL   Monocytes Relative 11 %   Monocytes Absolute 0.7 0.1 - 1.0 K/uL   Eosinophils Relative 8 %   Eosinophils Absolute 0.5 0.0 - 0.5 K/uL  Basophils Relative 2 %   Basophils Absolute 0.1 0.0 - 0.1 K/uL   Immature Granulocytes 0 %   Abs Immature Granulocytes 0.02 0.00 - 0.07 K/uL  Basic metabolic panel  Result Value Ref Range   Sodium 136 135 - 145 mmol/L   Potassium 3.9 3.5 - 5.1 mmol/L   Chloride 101 98 - 111 mmol/L   CO2 25 22 - 32 mmol/L   Glucose, Bld 99 70 - 99 mg/dL   BUN 9 6 - 20 mg/dL   Creatinine, Ser 5.92 0.61 - 1.24 mg/dL   Calcium 8.9 8.9 - 92.4 mg/dL   GFR, Estimated >46 >28 mL/min   Anion gap 10 5 - 15  Troponin I (High Sensitivity)  Result Value Ref Range   Troponin I (High Sensitivity) 2 <18 ng/L   DG Chest Portable 1 View  Result Date: 03/15/2020 CLINICAL DATA:  Acute chest pain. EXAM: PORTABLE CHEST 1 VIEW COMPARISON:  12/15/2018 chest radiograph FINDINGS: The cardiomediastinal silhouette is unremarkable. There is no evidence of focal airspace disease, pulmonary edema, suspicious pulmonary nodule/mass, pleural effusion, or pneumothorax. No acute bony abnormalities are identified. IMPRESSION: No active disease. Electronically Signed   By: Harmon Pier M.D.   On: 03/15/2020 05:56    EKG EKG Interpretation  Date/Time:  Saturday March 15 2020 03:53:20 EST Ventricular Rate:  70 PR Interval:    QRS Duration: 102 QT Interval:  382 QTC Calculation: 413 R Axis:   79 Text Interpretation: Sinus rhythm Abnormal R-wave progression, early transition Confirmed by Nicanor Alcon, Tiran Sauseda ([id number]) on 03/15/2020 4:29:17 AM   Radiology No results found.  Procedures Procedures (including critical care time)  Medications Ordered in ED Medications  alum & mag hydroxide-simeth (MAALOX/MYLANTA) 200-200-20 MG/5ML suspension 30 mL (30 mLs Oral Given 03/15/20 0406)  ketorolac (TORADOL)  injection 60 mg (60 mg Intramuscular Given 03/15/20 0429)    ED Course  I have reviewed the triage vital signs and the nursing notes.  Pertinent labs & imaging results that were available during my care of the patient were reviewed by me and considered in my medical decision making (see chart for details).   Perc negative wells 0 highly doubt PE in this low risk patient.  Ruled out for MI with negative EKG and 2 negative troponins.  I suspect this is GERD.  I will start a gerd friendly diet and a PPI.  Follow up with your PMD for ongoing care.  Strict return precautions given.    Pamela KLEBER CREAN was evaluated in Emergency Department on 03/15/2020 for the symptoms described in the history of present illness. He was evaluated in the context of the global COVID-19 pandemic, which necessitated consideration that the patient might be at risk for infection with the SARS-CoV-2 virus that causes COVID-19. Institutional protocols and algorithms that pertain to the evaluation of patients at risk for COVID-19 are in a state of rapid change based on information released by regulatory bodies including the CDC and federal and state organizations. These policies and algorithms were followed during the patient's care in the ED.  Final Clinical Impression(s) / ED Diagnoses Final diagnoses:  Precordial pain   Return for intractable cough, coughing up blood,fevers >100.4 unrelieved by medication, shortness of breath, intractable vomiting, chest pain, shortness of breath, weakness,numbness, changes in speech, facial asymmetry,abdominal pain, passing out,Inability to tolerate liquids or food, cough, altered mental status or any concerns. No signs of systemic illness or infection. The patient is nontoxic-appearing on exam and vital signs are within normal  limits.   I have reviewed the triage vital signs and the nursing notes. Pertinent labs &imaging results that were available during my care of the patient  were reviewed by me and considered in my medical decision making (see chart for details).After history, exam, and medical workup I feel the patient has beenappropriately medically screened and is safe for discharge home. Pertinent diagnoses were discussed with the patient. Patient was given return precautions. Rx / DC Orders ED Discharge Orders         Ordered    omeprazole (PRILOSEC) 20 MG capsule  Daily        03/15/20 0449           Audelia Knape, MD 03/15/20 [id number]

## 2020-03-17 ENCOUNTER — Other Ambulatory Visit: Payer: Self-pay

## 2020-03-17 ENCOUNTER — Encounter (HOSPITAL_BASED_OUTPATIENT_CLINIC_OR_DEPARTMENT_OTHER): Payer: Self-pay

## 2020-03-17 ENCOUNTER — Telehealth: Payer: Self-pay | Admitting: Cardiology

## 2020-03-17 ENCOUNTER — Emergency Department (HOSPITAL_BASED_OUTPATIENT_CLINIC_OR_DEPARTMENT_OTHER)
Admission: EM | Admit: 2020-03-17 | Discharge: 2020-03-17 | Disposition: A | Discharge status: Home / Self Care | Payer: Self-pay | Attending: Emergency Medicine | Admitting: Emergency Medicine

## 2020-03-17 DIAGNOSIS — M79601 Pain in right arm: Secondary | ICD-10-CM

## 2020-03-17 DIAGNOSIS — Z79899 Other long term (current) drug therapy: Secondary | ICD-10-CM | POA: Insufficient documentation

## 2020-03-17 DIAGNOSIS — Z7982 Long term (current) use of aspirin: Secondary | ICD-10-CM | POA: Insufficient documentation

## 2020-03-17 DIAGNOSIS — M79632 Pain in left forearm: Secondary | ICD-10-CM | POA: Insufficient documentation

## 2020-03-17 DIAGNOSIS — I4811 Longstanding persistent atrial fibrillation: Secondary | ICD-10-CM | POA: Insufficient documentation

## 2020-03-17 DIAGNOSIS — I480 Paroxysmal atrial fibrillation: Secondary | ICD-10-CM

## 2020-03-17 DIAGNOSIS — M79602 Pain in left arm: Secondary | ICD-10-CM

## 2020-03-17 DIAGNOSIS — R202 Paresthesia of skin: Secondary | ICD-10-CM | POA: Insufficient documentation

## 2020-03-17 DIAGNOSIS — F17290 Nicotine dependence, other tobacco product, uncomplicated: Secondary | ICD-10-CM | POA: Insufficient documentation

## 2020-03-17 DIAGNOSIS — M79631 Pain in right forearm: Secondary | ICD-10-CM | POA: Insufficient documentation

## 2020-03-17 MED ORDER — METOPROLOL SUCCINATE ER 25 MG PO TB24: 25.0000 mg | ORAL_TABLET | Freq: Every day | ORAL | 3 refills | Status: DC

## 2020-03-17 NOTE — Telephone Encounter (Signed)
Spoke with patient and he woke up with left arm numbness, and  pain that sometimes moves from left side to his other side. BP has been spiking up and down for various reasons. He has obtained these readings in the emergency room. He is unable to obtain blood pressure this morning. Currently having pain in left arm. Was given a shot of pain medication when he went to the emergency room on Friday. Patient questions whether he has chest pain or not at this time. He states the pain and numbness in his left arm has moved into his chest off and on over the weekend. Advised patient to go to the nearest ER for evaluation. He would like to speak to Stanton Kidney, Dr. Ludwig Clarks nurse.

## 2020-03-17 NOTE — Telephone Encounter (Signed)
Spoke with pt, he did go to the ER this morning and everything checked out fine. He is still having some pain that comes and goes in this arms and he feels his fingers maybe swollen. He is going to try some tylenol or ibuprofen for the pain. He requested a virtual visit with dr Jens Som and that was scheduled at the patients convenience. He will also touch base with his medical doctor if the discomfort continues.

## 2020-03-17 NOTE — Telephone Encounter (Signed)
Patient wants to discuss so problems he has been having with Dr. Jens Som or nurse. Please call back

## 2020-03-17 NOTE — Discharge Instructions (Addendum)
The pain does not appear to be coming from your heart.  Follow-up with your doctor and your cardiologist as needed.

## 2020-03-17 NOTE — ED Provider Notes (Signed)
[facility] EMERGENCY DEPARTMENT Provider Note   CSN: [id number] Arrival date & time: 03/17/20  6789     History Chief Complaint  Patient presents with  . Arm Pain    Roger Mitchell is a 39 y.o. male.  HPI Patient presents with forearm pain.  Primary left forearm but does not involve the right forearm.  Some radiation up left arm.  Had recently been seen for chest pain and worries this is involved.  Worse with certain movements.  Pain was worse when he woke up this morning.  No chest pain however.  No numbness or weakness but states he does get tingling down the little finger area of his hand on both sides.  No headache.  No confusion.  States history of A. fib but is not on anticoagulation.  States that never had a heart attack.  No direct trauma.  Patient drives for [employer].  Also works out but has not worked out recently.    Past Medical History:  Diagnosis Date  . ADD (attention deficit disorder) without hyperactivity   . Atrial fibrillation, persistent (HCC)   . Irregular heart beat   . Nicotine dependence   . Ocular migraine   . Pain in testicle   . Sleep disorder    shift work  . Testicular hypofunction     Patient Active Problem List   Diagnosis Date Noted  . Adult attention deficit disorder 10/20/2010  . TESTICULAR HYPOFUNCTION 03/03/2009  . BREAST MASS, LEFT 08/04/2006  . SYMPTOM, POLYURIA 08/04/2006  . PATELLO FEMORAL STRESS SYNDROME 06/23/2006    Past Surgical History:  Procedure Laterality Date  . none         Family History  Problem Relation Age of Onset  . Hypertension Mother   . Hypertension Father   . Hypertension Maternal Grandmother   . Hypertension Maternal Grandfather     Social History   Tobacco Use  . Smoking status: Current Every Day Smoker    Types: E-cigarettes  . Smokeless tobacco: Never Used  Vaping Use  . Vaping Use: Every day  Substance Use Topics  . Alcohol use: Yes    Comment: 6 glasses wine per week  .  Drug use: Yes    Types: Marijuana    Home Medications Prior to Admission medications   Medication Sig Start Date End Date Taking? Authorizing Provider  aspirin 81 MG tablet Take 1 tablet (81 mg total) by mouth daily. 01/25/18   Lewayne Bunting, MD  Melatonin 3 MG TABS Take 2 tablets by mouth at bedtime. As needed for sleep    [provider]  methocarbamol (ROBAXIN) 500 MG tablet Take 1 tablet (500 mg total) by mouth 2 (two) times daily. 09/05/19   Bethel Born, PA-C  metoprolol succinate (TOPROL XL) 25 MG 24 hr tablet Take 1 tablet (25 mg total) by mouth daily. 07/24/19   Lewayne Bunting, MD  omeprazole (PRILOSEC) 20 MG capsule Take 1 capsule (20 mg total) by mouth daily. 03/15/20   Palumbo, April, MD    Allergies    Patient has no known allergies.  Review of Systems   Review of Systems  Constitutional: Negative for appetite change.  Respiratory: Negative for shortness of breath.   Cardiovascular: Positive for chest pain.  Gastrointestinal: Negative for abdominal pain.  Genitourinary: Negative for flank pain.  Musculoskeletal: Negative for back pain.       Bilateral forearm pain.  Neurological: Negative for weakness and numbness.  Psychiatric/Behavioral:  Negative for confusion.    Physical Exam Updated Vital Signs BP (!) 141/95 (BP Location: Right Arm)   Pulse 90   Temp 98 F (36.7 C) (Oral)   Resp 16   Ht 5\' 8"  (1.727 m)   Wt 87.5 kg   SpO2 98%   BMI 29.35 kg/m   Physical Exam Vitals and nursing note reviewed.  HENT:     Head: Atraumatic.  Eyes:     General: No scleral icterus. Cardiovascular:     Rate and Rhythm: Normal rate and regular rhythm.     Pulses: Normal pulses.  Pulmonary:     Breath sounds: No wheezing, rhonchi or rales.  Chest:     Chest wall: No tenderness.  Abdominal:     Tenderness: There is no abdominal tenderness.  Musculoskeletal:     Cervical back: Neck supple. No tenderness.     Comments: Good strength and sensation in  bilateral hands.  No skin changes.  No tenderness over elbow.  Good flexion extension at the wrist bilaterally.  Good strength in radial median ulnar distributions.  Strong radial pulse bilaterally.  Skin:    General: Skin is warm.     Capillary Refill: Capillary refill takes less than 2 seconds.  Neurological:     Mental Status: He is alert and oriented to person, place, and time.     ED Results / Procedures / Treatments   Labs (all labs ordered are listed, but only abnormal results are displayed) Labs Reviewed - No data to display  EKG EKG Interpretation  Date/Time:  Monday March 17 2020 09:26:43 EST Ventricular Rate:  65 PR Interval:    QRS Duration: 105 QT Interval:  393 QTC Calculation: 409 R Axis:   68 Text Interpretation: Sinus rhythm Abnormal R-wave progression, early transition No significant change since last tracing Confirmed by [id number] [phone]) on 03/17/2020 9:35:33 AM   Radiology No results found.  Procedures Procedures (including critical care time)  Medications Ordered in ED Medications - No data to display  ED Course  I have reviewed the triage vital signs and the nursing notes.  Pertinent labs & imaging results that were available during my care of the patient were reviewed by me and considered in my medical decision making (see chart for details).    MDM Rules/Calculators/A&P                          Patient with bilateral forearm pain.  Comes and goes somewhat.  Does appear to have some paresthesias at times on the little fingers.  Benign exam otherwise.  Recent cardiac work-up done and reassuring.  EKG stable from prior.  Doubt this is an anginal equivalent.  Does have reported history of anxiety.  Doubt this is a clot from A. fib.  Also is in sinus rhythm.  Discharge home with outpatient follow-up as needed. Final Clinical Impression(s) / ED Diagnoses Final diagnoses:  Pain in both upper extremities    Rx / DC Orders ED Discharge  Orders    None       03/19/2020, MD 03/17/20 1007

## 2020-03-17 NOTE — ED Triage Notes (Addendum)
Pt reports bilateral forearm pain for 2-3 days along with numbness in both hands. Pt denies trauma.

## 2020-03-17 NOTE — Telephone Encounter (Signed)
Left message for pt to call.

## 2020-03-25 ENCOUNTER — Encounter (HOSPITAL_BASED_OUTPATIENT_CLINIC_OR_DEPARTMENT_OTHER): Payer: Self-pay | Admitting: Emergency Medicine

## 2020-03-25 ENCOUNTER — Emergency Department (HOSPITAL_BASED_OUTPATIENT_CLINIC_OR_DEPARTMENT_OTHER)
Admission: EM | Admit: 2020-03-25 | Discharge: 2020-03-25 | Disposition: A | Discharge status: Home / Self Care | Payer: Self-pay | Attending: Emergency Medicine | Admitting: Emergency Medicine

## 2020-03-25 ENCOUNTER — Other Ambulatory Visit: Payer: Self-pay

## 2020-03-25 DIAGNOSIS — F17290 Nicotine dependence, other tobacco product, uncomplicated: Secondary | ICD-10-CM | POA: Insufficient documentation

## 2020-03-25 DIAGNOSIS — Z7982 Long term (current) use of aspirin: Secondary | ICD-10-CM | POA: Insufficient documentation

## 2020-03-25 DIAGNOSIS — R0789 Other chest pain: Secondary | ICD-10-CM | POA: Insufficient documentation

## 2020-03-25 LAB — CBC WITH DIFFERENTIAL/PLATELET
Abs Immature Granulocytes: 0.03 10*3/uL (ref 0.00–0.07)
Basophils Absolute: 0.1 10*3/uL (ref 0.0–0.1)
Basophils Relative: 1 %
Eosinophils Absolute: 0.3 10*3/uL (ref 0.0–0.5)
Eosinophils Relative: 6 %
HCT: 40.3 % (ref 39.0–52.0)
Hemoglobin: 14.2 g/dL (ref 13.0–17.0)
Immature Granulocytes: 1 %
Lymphocytes Relative: 38 %
Lymphs Abs: 2.2 10*3/uL (ref 0.7–4.0)
MCH: 31.8 pg (ref 26.0–34.0)
MCHC: 35.2 g/dL (ref 30.0–36.0)
MCV: 90.4 fL (ref 80.0–100.0)
Monocytes Absolute: 0.6 10*3/uL (ref 0.1–1.0)
Monocytes Relative: 11 %
Neutro Abs: 2.6 10*3/uL (ref 1.7–7.7)
Neutrophils Relative %: 43 %
Platelets: 220 10*3/uL (ref 150–400)
RBC: 4.46 MIL/uL (ref 4.22–5.81)
RDW: 11.6 % (ref 11.5–15.5)
WBC: 5.9 10*3/uL (ref 4.0–10.5)
nRBC: 0 % (ref 0.0–0.2)

## 2020-03-25 LAB — BASIC METABOLIC PANEL
Anion gap: 10 (ref 5–15)
BUN: 13 mg/dL (ref 6–20)
CO2: 23 mmol/L (ref 22–32)
Calcium: 9.1 mg/dL (ref 8.9–10.3)
Chloride: 105 mmol/L (ref 98–111)
Creatinine, Ser: 1.19 mg/dL (ref 0.61–1.24)
GFR, Estimated: >60 mL/min (ref >60)
Glucose, Bld: 113 mg/dL — ABNORMAL HIGH (ref 70–99)
Potassium: 3.9 mmol/L (ref 3.5–5.1)
Sodium: 138 mmol/L (ref 135–145)

## 2020-03-25 LAB — TROPONIN I (HIGH SENSITIVITY): Troponin I (High Sensitivity): <2 ng/L (ref <18)

## 2020-03-25 NOTE — ED Provider Notes (Signed)
[facility] Provider Note: Lowella Dell, MD, FACEP  CSN: [id number] MRN: [id number] ARRIVAL: 03/25/20 at 0028 ROOM: [facility]09/[facility]   CHIEF COMPLAINT  Chest Pain   HISTORY OF PRESENT ILLNESS  03/25/20 1:38 AM Roger Mitchell is a 39 y.o. male who developed chest pain yesterday afternoon about 2 PM.  He describes the pain as a burning or "pins-and-needles" sensation in his lower substernal region.  He rates it as a 5 out of 10.  Nothing makes it better or worse including movement, deep breathing, palpation, exertion or rest.  He has had no associated shortness of breath, diaphoresis or nausea.   Past Medical History:  Diagnosis Date  . ADD (attention deficit disorder) without hyperactivity   . Atrial fibrillation, persistent (HCC)   . Irregular heart beat   . Nicotine dependence   . Ocular migraine   . Pain in testicle   . Sleep disorder    shift work  . Testicular hypofunction     Past Surgical History:  Procedure Laterality Date  . none      Family History  Problem Relation Age of Onset  . Hypertension Mother   . Hypertension Father   . Hypertension Maternal Grandmother   . Hypertension Maternal Grandfather     Social History   Tobacco Use  . Smoking status: Current Every Day Smoker    Types: E-cigarettes  . Smokeless tobacco: Never Used  Vaping Use  . Vaping Use: Every day  Substance Use Topics  . Alcohol use: Yes    Comment: 6 glasses wine per week  . Drug use: Yes    Types: Marijuana    Prior to Admission medications   Medication Sig Start Date End Date Taking? Authorizing Provider  aspirin 81 MG tablet Take 1 tablet (81 mg total) by mouth daily. 01/25/18   Lewayne Bunting, MD  Melatonin 3 MG TABS Take 2 tablets by mouth at bedtime. As needed for sleep    [provider]  methocarbamol (ROBAXIN) 500 MG tablet Take 1 tablet (500 mg total) by mouth 2 (two) times daily. 09/05/19   Bethel Born, PA-C  metoprolol succinate  (TOPROL XL) 25 MG 24 hr tablet Take 1 tablet (25 mg total) by mouth daily. 03/17/20   Lewayne Bunting, MD  omeprazole (PRILOSEC) 20 MG capsule Take 1 capsule (20 mg total) by mouth daily. 03/15/20   Palumbo, April, MD    Allergies Patient has no known allergies.   REVIEW OF SYSTEMS  Negative except as noted here or in the History of Present Illness.   PHYSICAL EXAMINATION  Initial Vital Signs Blood pressure 131/72, pulse (!) 57, temperature 98.5 F (36.9 C), temperature source Oral, resp. rate 14, height 5\' 8"  (1.727 m), weight 87.5 kg, SpO2 98 %.  Examination General: Well-developed, well-nourished male in no acute distress; appearance consistent with age of record HENT: normocephalic; atraumatic Eyes: pupils equal, round and reactive to light; extraocular muscles intact Neck: supple Heart: regular rate and rhythm; no murmur Lungs: clear to auscultation bilaterally Abdomen: soft; nondistended; nontender;  bowel sounds present Extremities: No deformity; full range of motion; pulses normal Neurologic: Awake, alert and oriented; motor function intact in all extremities and symmetric; no facial droop Skin: Warm and dry Psychiatric: Normal mood and affect   RESULTS  Summary of this visit's results, reviewed and interpreted by myself:   EKG Interpretation  Date/Time:  Tuesday March 25 2020 00:53:54 EST Ventricular Rate:  60 PR Interval:  QRS Duration: 104 QT Interval:  385 QTC Calculation: 385 R Axis:   76 Text Interpretation: Sinus rhythm No significant change was found Confirmed by Paula Libra ([id number]) on 03/25/2020 12:56:45 AM      Laboratory Studies: Results for orders placed or performed during the hospital encounter of 03/25/20 (from the past 24 hour(s))  CBC with Differential/Platelet     Status: None   Collection Time: 03/25/20  1:37 AM  Result Value Ref Range   WBC 5.9 4.0 - 10.5 K/uL   RBC 4.46 4.22 - 5.81 MIL/uL   Hemoglobin 14.2 13.0 - 17.0 g/dL    HCT 01.7 39 - 52 %   MCV 90.4 80.0 - 100.0 fL   MCH 31.8 26.0 - 34.0 pg   MCHC 35.2 30.0 - 36.0 g/dL   RDW 49.4 49.6 - 75.9 %   Platelets 220 150 - 400 K/uL   nRBC 0.0 0.0 - 0.2 %   Neutrophils Relative % 43 %   Neutro Abs 2.6 1.7 - 7.7 K/uL   Lymphocytes Relative 38 %   Lymphs Abs 2.2 0.7 - 4.0 K/uL   Monocytes Relative 11 %   Monocytes Absolute 0.6 0.1 - 1.0 K/uL   Eosinophils Relative 6 %   Eosinophils Absolute 0.3 0.0 - 0.5 K/uL   Basophils Relative 1 %   Basophils Absolute 0.1 0.0 - 0.1 K/uL   Immature Granulocytes 1 %   Abs Immature Granulocytes 0.03 0.00 - 0.07 K/uL  Basic metabolic panel     Status: Abnormal   Collection Time: 03/25/20  1:37 AM  Result Value Ref Range   Sodium 138 135 - 145 mmol/L   Potassium 3.9 3.5 - 5.1 mmol/L   Chloride 105 98 - 111 mmol/L   CO2 23 22 - 32 mmol/L   Glucose, Bld 113 (H) 70 - 99 mg/dL   BUN 13 6 - 20 mg/dL   Creatinine, Ser 1.63 0.61 - 1.24 mg/dL   Calcium 9.1 8.9 - 84.6 mg/dL   GFR, Estimated >65 >99 mL/min   Anion gap 10 5 - 15  Troponin I (High Sensitivity)     Status: None   Collection Time: 03/25/20  1:37 AM  Result Value Ref Range   Troponin I (High Sensitivity) <2 <18 ng/L   Imaging Studies: No results found.  ED COURSE and MDM  Nursing notes, initial and subsequent vitals signs, including pulse oximetry, reviewed and interpreted by myself.  Vitals:   03/25/20 0035 03/25/20 0040 03/25/20 0100 03/25/20 0130  BP:  (!) 146/96 131/72 118/87  Pulse:  66 (!) 57 (!) 50  Resp:  16 14 16   Temp:  98.5 F (36.9 C)    TempSrc:  Oral    SpO2:  99% 98% 97%  Weight: 87.5 kg     Height: 5\' 8"  (1.727 m)      Medications - No data to display  3:28 AM The patient's pain began over 12 hours ago and he has an unchanged EKG and normal troponin.  His presentation is atypical for acute coronary syndrome.  PROCEDURES  Procedures   ED DIAGNOSES     ICD-10-CM   1. Atypical chest pain  R07.89        ,  MD 03/25/20 [phone]

## 2020-03-25 NOTE — ED Triage Notes (Signed)
Patient presents with complaints of left to mid sternal chest pain onset yesterday; states took reflux med with no relief.

## 2020-04-07 NOTE — Progress Notes (Deleted)
Virtual Visit via Telephone Note   This visit type was conducted due to national recommendations for restrictions regarding the COVID-19 Pandemic (e.g. social distancing) in an effort to limit this patient's exposure and mitigate transmission in our community.  Due to his co-morbid illnesses, this patient is at least at moderate risk for complications without adequate follow up.  This format is felt to be most appropriate for this patient at this time.  The patient did not have access to video technology/had technical difficulties with video requiring transitioning to audio format only (telephone).  All issues noted in this document were discussed and addressed.  No physical exam could be performed with this format.  Please refer to the patient's chart for his  consent to telehealth for [facility].   Date:  04/07/2020   ID:  Roger Mitchell, DOB 1980/06/01, MRN [id number]  Patient Location:Home Provider Location: Home  PCP:  Patient, No Pcp Per  Cardiologist:  Dr Jens Som  Evaluation Performed:  Follow-Up Visit  Chief Complaint:  FU atrial fibrillation and chest pain  History of Present Illness:    FU atrial fibrillation.Patient apparently was to give blood with [employer] and was found to have an irregular heartbeat. An electrocardiogramSeptember 19, 2019 showed atrial fibrillation. At [facility] patient was in sinus rhythm. Echocardiogram May 2021 showed normal LV function, grade 1 diastolic dysfunction.  Patient seen in the emergency room November 2021 with atypical chest pain.  Chest x-ray negative.  Troponins normal.  Since last seen   The patient does not have symptoms concerning for COVID-19 infection (fever, chills, cough, or new shortness of breath).    Past Medical History:  Diagnosis Date  . ADD (attention deficit disorder) without hyperactivity   . Atrial fibrillation, persistent (HCC)   . Irregular heart beat   . Nicotine dependence   . Ocular migraine   . Pain in  testicle   . Sleep disorder    shift work  . Testicular hypofunction    Past Surgical History:  Procedure Laterality Date  . none       No outpatient medications have been marked as taking for the 04/11/20 encounter (Appointment) with Lewayne Bunting, MD.     Allergies:   Patient has no known allergies.   Social History   Tobacco Use  . Smoking status: Current Every Day Smoker    Types: E-cigarettes  . Smokeless tobacco: Never Used  Vaping Use  . Vaping Use: Every day  Substance Use Topics  . Alcohol use: Yes    Comment: 6 glasses wine per week  . Drug use: Yes    Types: Marijuana     Family Hx: The patient's family history includes Hypertension in his father, maternal grandfather, maternal grandmother, and mother.  ROS:   Please see the history of present illness.    No Fever, chills  or productive cough All other systems reviewed and are negative.   Recent Labs: 01/05/2020: ALT 198 03/25/2020: BUN 13; Creatinine, Ser 1.19; Hemoglobin 14.2; Platelets 220; Potassium 3.9; Sodium 138   Recent Lipid Panel Lab Results  Component Value Date/Time   CHOL 157 08/04/2006 08:40 PM   TRIG 59 08/04/2006 08:40 PM   HDL 55 08/04/2006 08:40 PM   CHOLHDL 2.9 Ratio 08/04/2006 08:40 PM   LDLCALC 90 08/04/2006 08:40 PM    Wt Readings from Last 3 Encounters:  03/25/20 192 lb 14.4 oz (87.5 kg)  03/17/20 193 lb (87.5 kg)  03/15/20 190 lb (86.2 kg)  Objective:    Vital Signs:  There were no vitals taken for this visit.   VITAL SIGNS:  reviewed NAD Answers questions appropriately Normal affect Remainder of physical examination not performed (telehealth visit; coronavirus pandemic)  Electrocardiogram March 25, 2020 personally reviewed and showed sinus rhythm with no ST changes.  ASSESSMENT & PLAN:    1. Paroxysmal atrial fibrillation-based on history patient remains in sinus rhythm.  Most recent electrocardiogram also showed sinus.  Echocardiogram shows normal  LV function.  Continue Toprol 25 mg daily.  He is also on aspirin.  He has no embolic risk factors. 2. Chest pain-symptoms are atypical.  Previous D-dimer normal.  Electrocardiogram showed no ST changes. 3. Vaping-patient again counseled on avoiding. 4. ADD-Per primary care.  COVID-19 Education: The importance of social distancing was discussed today.  Time:   Today, I have spent 17 minutes with the patient with telehealth technology discussing the above problems.     Medication Adjustments/Labs and Tests Ordered: Current medicines are reviewed at length with the patient today.  Concerns regarding medicines are outlined above.   Tests Ordered: No orders of the defined types were placed in this encounter.   Medication Changes: No orders of the defined types were placed in this encounter.   Follow Up:  In Person in 1 year(s)  Signed, Olga Millers, MD  04/07/2020 9:03 AM    [facility]

## 2020-04-11 ENCOUNTER — Telehealth: Payer: Self-pay | Admitting: Cardiology

## 2020-04-15 ENCOUNTER — Emergency Department (HOSPITAL_BASED_OUTPATIENT_CLINIC_OR_DEPARTMENT_OTHER)
Admission: EM | Admit: 2020-04-15 | Discharge: 2020-04-15 | Disposition: A | Discharge status: Home / Self Care | Payer: Self-pay | Attending: Emergency Medicine | Admitting: Emergency Medicine

## 2020-04-15 ENCOUNTER — Other Ambulatory Visit: Payer: Self-pay

## 2020-04-15 ENCOUNTER — Encounter (HOSPITAL_BASED_OUTPATIENT_CLINIC_OR_DEPARTMENT_OTHER): Payer: Self-pay | Admitting: Emergency Medicine

## 2020-04-15 DIAGNOSIS — Z79899 Other long term (current) drug therapy: Secondary | ICD-10-CM | POA: Insufficient documentation

## 2020-04-15 DIAGNOSIS — F17290 Nicotine dependence, other tobacco product, uncomplicated: Secondary | ICD-10-CM | POA: Insufficient documentation

## 2020-04-15 DIAGNOSIS — Z7982 Long term (current) use of aspirin: Secondary | ICD-10-CM | POA: Insufficient documentation

## 2020-04-15 DIAGNOSIS — R002 Palpitations: Secondary | ICD-10-CM | POA: Insufficient documentation

## 2020-04-15 LAB — CBC WITH DIFFERENTIAL/PLATELET
Abs Immature Granulocytes: 0.03 10*3/uL (ref 0.00–0.07)
Basophils Absolute: 0.1 10*3/uL (ref 0.0–0.1)
Basophils Relative: 1 %
Eosinophils Absolute: 0.7 10*3/uL — ABNORMAL HIGH (ref 0.0–0.5)
Eosinophils Relative: 8 %
HCT: 42.9 % (ref 39.0–52.0)
Hemoglobin: 15.2 g/dL (ref 13.0–17.0)
Immature Granulocytes: 0 %
Lymphocytes Relative: 39 %
Lymphs Abs: 3.2 10*3/uL (ref 0.7–4.0)
MCH: 32.1 pg (ref 26.0–34.0)
MCHC: 35.4 g/dL (ref 30.0–36.0)
MCV: 90.7 fL (ref 80.0–100.0)
Monocytes Absolute: 0.8 10*3/uL (ref 0.1–1.0)
Monocytes Relative: 10 %
Neutro Abs: 3.3 10*3/uL (ref 1.7–7.7)
Neutrophils Relative %: 42 %
Platelets: 233 10*3/uL (ref 150–400)
RBC: 4.73 MIL/uL (ref 4.22–5.81)
RDW: 11.8 % (ref 11.5–15.5)
WBC: 8.1 10*3/uL (ref 4.0–10.5)
nRBC: 0 % (ref 0.0–0.2)

## 2020-04-15 LAB — BASIC METABOLIC PANEL
Anion gap: 11 (ref 5–15)
BUN: 16 mg/dL (ref 6–20)
CO2: 25 mmol/L (ref 22–32)
Calcium: 9.5 mg/dL (ref 8.9–10.3)
Chloride: 101 mmol/L (ref 98–111)
Creatinine, Ser: 1.31 mg/dL — ABNORMAL HIGH (ref 0.61–1.24)
GFR, Estimated: >60 mL/min (ref >60)
Glucose, Bld: 96 mg/dL (ref 70–99)
Potassium: 3.8 mmol/L (ref 3.5–5.1)
Sodium: 137 mmol/L (ref 135–145)

## 2020-04-15 LAB — MAGNESIUM: Magnesium: 2 mg/dL (ref 1.7–2.4)

## 2020-04-15 MED ORDER — POTASSIUM CHLORIDE CRYS ER 20 MEQ PO TBCR
40.0000 meq | EXTENDED_RELEASE_TABLET | Freq: Once | ORAL | Status: AC
  Administered 2020-04-15: 40 meq via ORAL
  Filled 2020-04-15: qty 2

## 2020-04-15 NOTE — Discharge Instructions (Signed)
Your EKG looked good.  You potassium was on the low end of normal, so we gave you some potassium vitamins.  You kidney function was similar to in the past.  Continue to stay hydrated.  Please follow-up with your cardiologist.  If your symptoms change or worsen, please return to the ER.

## 2020-04-15 NOTE — ED Triage Notes (Signed)
Pt reports "feeling an irregular hearbeat for 2hours and ; pt reports hx of same; pt denies chest pain, N/V, SHOB

## 2020-04-15 NOTE — ED Triage Notes (Signed)
Pt reports taking 81mg  of aspirin PTA

## 2020-04-15 NOTE — ED Provider Notes (Signed)
[facility] EMERGENCY DEPARTMENT Provider Note   CSN: [id number] Arrival date & time: 04/15/20  9211     History Chief Complaint  Patient presents with  . Palpitations    Roger Mitchell is a 39 y.o. male.  Patient with past medical history notable for A. fib on metoprolol daily, presents to the emergency department with a chief complaint of palpitations.  He states that he was having palpitations for approximately 3 hours while trying to sleep tonight.  He denies having any chest pain, shortness of breath, nausea, or vomiting.  Denies any other associated symptoms.  He states that he tried taking 81 mg of aspirin prior to arrival with no improvement of symptoms.  Denies any recent illnesses.  He states that he did drink a little more caffeine yesterday than usual.  He denies any other associated symptoms.  States that he no longer feels palpitations now.  The history is provided by the patient. No language interpreter was used.       Past Medical History:  Diagnosis Date  . ADD (attention deficit disorder) without hyperactivity   . Atrial fibrillation, persistent (HCC)   . Irregular heart beat   . Nicotine dependence   . Ocular migraine   . Pain in testicle   . Sleep disorder    shift work  . Testicular hypofunction     Patient Active Problem List   Diagnosis Date Noted  . Adult attention deficit disorder 10/20/2010  . TESTICULAR HYPOFUNCTION 03/03/2009  . BREAST MASS, LEFT 08/04/2006  . SYMPTOM, POLYURIA 08/04/2006  . PATELLO FEMORAL STRESS SYNDROME 06/23/2006    Past Surgical History:  Procedure Laterality Date  . none         Family History  Problem Relation Age of Onset  . Hypertension Mother   . Hypertension Father   . Hypertension Maternal Grandmother   . Hypertension Maternal Grandfather     Social History   Tobacco Use  . Smoking status: Current Every Day Smoker    Types: E-cigarettes  . Smokeless tobacco: Never Used  Vaping Use   . Vaping Use: Every day  Substance Use Topics  . Alcohol use: Yes    Comment: 6 glasses wine per week  . Drug use: Not Currently    Types: Marijuana    Home Medications Prior to Admission medications   Medication Sig Start Date End Date Taking? Authorizing Provider  aspirin 81 MG tablet Take 1 tablet (81 mg total) by mouth daily. 01/25/18  Yes Lewayne Bunting, MD  Melatonin 3 MG TABS Take 2 tablets by mouth at bedtime. As needed for sleep   Yes [provider]  metoprolol succinate (TOPROL XL) 25 MG 24 hr tablet Take 1 tablet (25 mg total) by mouth daily. 03/17/20  Yes Lewayne Bunting, MD  omeprazole (PRILOSEC) 20 MG capsule Take 1 capsule (20 mg total) by mouth daily. 03/15/20  Yes Palumbo, April, MD  methocarbamol (ROBAXIN) 500 MG tablet Take 1 tablet (500 mg total) by mouth 2 (two) times daily. 09/05/19   Bethel Born, PA-C    Allergies    Patient has no known allergies.  Review of Systems   Review of Systems  All other systems reviewed and are negative.   Physical Exam Updated Vital Signs BP 135/88   Pulse (!) 54   Temp 98.7 F (37.1 C) (Oral)   Resp 15   Ht 5\' 8"  (1.727 m)   Wt 88.5 kg   SpO2 98%  BMI 29.65 kg/m   Physical Exam Vitals and nursing note reviewed.  Constitutional:      Appearance: He is well-developed and well-nourished.  HENT:     Head: Normocephalic and atraumatic.  Eyes:     Conjunctiva/sclera: Conjunctivae normal.  Cardiovascular:     Rate and Rhythm: Normal rate and regular rhythm.     Heart sounds: No murmur heard.   Pulmonary:     Effort: Pulmonary effort is normal. No respiratory distress.     Breath sounds: Normal breath sounds.  Abdominal:     Palpations: Abdomen is soft.     Tenderness: There is no abdominal tenderness.  Musculoskeletal:        General: No edema. Normal range of motion.     Cervical back: Neck supple.  Skin:    General: Skin is warm and dry.  Neurological:     Mental Status: He is alert  and oriented to person, place, and time.  Psychiatric:        Mood and Affect: Mood and affect and mood normal.        Behavior: Behavior normal.     ED Results / Procedures / Treatments   Labs (all labs ordered are listed, but only abnormal results are displayed) Labs Reviewed  CBC WITH DIFFERENTIAL/PLATELET  BASIC METABOLIC PANEL  MAGNESIUM    EKG EKG Interpretation  Date/Time:  Tuesday April 15 2020 03:47:09 EST Ventricular Rate:  58 PR Interval:    QRS Duration: 105 QT Interval:  398 QTC Calculation: 391 R Axis:   74 Text Interpretation: Sinus rhythm Confirmed by Ross Marcus ([id number]) on 04/15/2020 3:52:52 AM   Radiology No results found.  Procedures Procedures (including critical care time)  Medications Ordered in ED Medications - No data to display  ED Course  I have reviewed the triage vital signs and the nursing notes.  Pertinent labs & imaging results that were available during my care of the patient were reviewed by me and considered in my medical decision making (see chart for details).    MDM Rules/Calculators/A&P                          This patient complains of palpitations, this involves an extensive number of treatment options, and is a complaint that carries with it a high risk of complications and morbidity.    Differential Dx Afib, SVT, PVCs, other dysrhythmia, anxiety  Pertinent Labs I ordered, reviewed, and interpreted labs, which included CBC, BMP, magnesium notable for low-end of normal potassium. Creatinine was about baseline.  Medications I ordered medication potassium.  Reassessments After the interventions stated above, I reevaluated the patient and found the patient stable and with no further palpitations.  The palpitations stopped PTA.  Consultants none  Plan DC with PCP/Cards follow-up.  Return precautions given.    Final Clinical Impression(s) / ED Diagnoses Final diagnoses:  Palpitations    Rx / DC  Orders ED Discharge Orders    None       Roxy Horseman, PA-C 04/15/20 [id number]    Shon Baton, MD 04/15/20 [phone]

## 2020-04-24 NOTE — Progress Notes (Signed)
Virtual Visit via Telephone Note changed to phone visit at patient request   This visit type was conducted due to national recommendations for restrictions regarding the COVID-19 Pandemic (e.g. social distancing) in an effort to limit this patient's exposure and mitigate transmission in our community.  Due to his co-morbid illnesses, this patient is at least at moderate risk for complications without adequate follow up.  This format is felt to be most appropriate for this patient at this time.  The patient did not have access to video technology/had technical difficulties with video requiring transitioning to audio format only (telephone).  All issues noted in this document were discussed and addressed.  No physical exam could be performed with this format.  Please refer to the patient's chart for his  consent to telehealth for [facility].   Date:  05/02/2020   ID:  Roger Mitchell, DOB 03/06/1981, MRN [id number]  Patient Location:Home Provider Location: Home  PCP:  Patient, No Pcp Per  Cardiologist:  Dr Jens Som  Evaluation Performed:  Follow-Up Visit  Chief Complaint:  FU atrial fibrillation and chest pain  History of Present Illness:    FU atrial fibrillation.Patient apparently was to give blood with [employer] and was found to have an irregular heartbeat. An electrocardiogramSeptember 19, 2019 showed atrial fibrillation. At [facility] patient was in sinus rhythm. Echocardiogram May 2021 showed normal LV function, grade 1 diastolic dysfunction. Patient seen in the emergency room November 2021 with atypical chest pain. Chest x-ray negative. Troponins normal. Patient seen in the emergency room December 2021 with palpitations. Potassium 3.8, hemoglobin 15.2 and magnesium 2.0. Electrocardiogram April 15, 2020 showed sinus bradycardia with no ST changes. Since last seen  patient has had episodes of pounding that occurs when he has "anxiety".  He is concerned he is having rhythm disturbances.   He also describes chest pain that occurs with these episodes.  He does not have exertional chest pain.  He has not had syncope.  The patient does not have symptoms concerning for COVID-19 infection (fever, chills, cough, or new shortness of breath).    Past Medical History:  Diagnosis Date  . ADD (attention deficit disorder) without hyperactivity   . Atrial fibrillation, persistent (HCC)   . Irregular heart beat   . Nicotine dependence   . Ocular migraine   . Pain in testicle   . Sleep disorder    shift work  . Testicular hypofunction    Past Surgical History:  Procedure Laterality Date  . none       Current Meds  Medication Sig  . aspirin 81 MG tablet Take 1 tablet (81 mg total) by mouth daily.  . Melatonin 3 MG TABS Take 2 tablets by mouth at bedtime. As needed for sleep  . metoprolol succinate (TOPROL XL) 25 MG 24 hr tablet Take 1 tablet (25 mg total) by mouth daily.  . [DISCONTINUED] omeprazole (PRILOSEC) 20 MG capsule Take 1 capsule (20 mg total) by mouth daily.     Allergies:   Patient has no known allergies.   Social History   Tobacco Use  . Smoking status: Current Every Day Smoker    Types: E-cigarettes  . Smokeless tobacco: Never Used  Vaping Use  . Vaping Use: Every day  Substance Use Topics  . Alcohol use: Yes    Comment: 6 glasses wine per week  . Drug use: Not Currently    Types: Marijuana     Family Hx: The patient's family history includes Hypertension in  his father, maternal grandfather, maternal grandmother, and mother.  ROS:   Please see the history of present illness.    No Fever, chills  or productive cough All other systems reviewed and are negative.   Recent Labs: 01/05/2020: ALT 198 04/15/2020: BUN 16; Creatinine, Ser 1.31; Hemoglobin 15.2; Magnesium 2.0; Platelets 233; Potassium 3.8; Sodium 137   Recent Lipid Panel Lab Results  Component Value Date/Time   CHOL 157 08/04/2006 08:40 PM   TRIG 59 08/04/2006 08:40 PM   HDL 55  08/04/2006 08:40 PM   CHOLHDL 2.9 Ratio 08/04/2006 08:40 PM   LDLCALC 90 08/04/2006 08:40 PM    Wt Readings from Last 3 Encounters:  05/02/20 195 lb (88.5 kg)  04/15/20 195 lb (88.5 kg)  03/25/20 192 lb 14.4 oz (87.5 kg)     Objective:    Vital Signs:  BP (!) 124/93   Pulse 82   Ht 5' 8.5" (1.74 m)   Wt 195 lb (88.5 kg)   BMI 29.22 kg/m    VITAL SIGNS:  reviewed NAD Answers questions appropriately Normal affect Remainder of physical examination not performed (telehealth visit; coronavirus pandemic)  Electrocardiogram March 25, 2020 personally reviewed and showed sinus rhythm with no ST changes.  ASSESSMENT & PLAN:    1. Paroxysmal atrial fibrillation- Most recent electrocardiogram showed sinus.  Echocardiogram shows normal LV function.  Continue Toprol 25 mg daily.  He is also on aspirin.  He has no embolic risk factors. 2. Chest pain-symptoms are atypical.  Previous D-dimer normal.  Electrocardiogram showed no ST changes.  He is very concerned and we will arrange a cardiac CTA to exclude obstructive coronary disease. 3. Palpitations-we will arrange event monitor to further assess. 4. Vaping-patient again counseled on avoiding. 5. ADD-Per primary care. 6. Elevated blood pressure reading-I have asked him to follow this.  If he has documented hypertension may need to consider adding apixaban in the future.  COVID-19 Education: The importance of social distancing was discussed today.  Time:   Today, I have spent 17 minutes with the patient with telehealth technology discussing the above problems.     Medication Adjustments/Labs and Tests Ordered: Current medicines are reviewed at length with the patient today.  Concerns regarding medicines are outlined above.   Tests Ordered: No orders of the defined types were placed in this encounter.   Medication Changes: No orders of the defined types were placed in this encounter.   Follow Up:  In Person in 1  year(s)  Signed, Olga Millers, MD  05/02/2020 9:03 AM    [facility]

## 2020-05-02 ENCOUNTER — Telehealth (INDEPENDENT_AMBULATORY_CARE_PROVIDER_SITE_OTHER): Payer: Self-pay | Admitting: Cardiology

## 2020-05-02 ENCOUNTER — Telehealth: Payer: Self-pay | Admitting: Radiology

## 2020-05-02 ENCOUNTER — Encounter: Payer: Self-pay | Admitting: Cardiology

## 2020-05-02 VITALS — BP 124/93 | HR 82 | Ht 68.5 in | Wt 195.0 lb

## 2020-05-02 DIAGNOSIS — R072 Precordial pain: Secondary | ICD-10-CM

## 2020-05-02 DIAGNOSIS — I480 Paroxysmal atrial fibrillation: Secondary | ICD-10-CM

## 2020-05-02 DIAGNOSIS — R002 Palpitations: Secondary | ICD-10-CM

## 2020-05-02 MED ORDER — METOPROLOL TARTRATE 100 MG PO TABS: ORAL_TABLET | ORAL | 0 refills | Status: DC

## 2020-05-02 NOTE — Patient Instructions (Signed)
Medication Instructions:   NO CHANGE  *If you need a refill on your cardiac medications before your next appointment, please call your pharmacy*   Testing/Procedures:  Your physician has recommended that you wear an event monitor. Event monitors are medical devices that record the heart's electrical activity. Doctors most often [place] these monitors to diagnose arrhythmias. Arrhythmias are problems with the speed or rhythm of the heartbeat. The monitor is a small, portable device. You can wear one while you do your normal daily activities. This is usually used to diagnose what is causing palpitations/syncope (passing out).MAILED TO YOUR HOME   Your cardiac CT will be scheduled at one of the below locations:   [facility] [address] [place], [place] [address] [phone]   If scheduled at [facility], please arrive at the [facility] main entrance of [facility] 30 minutes prior to test start time. Proceed to the [facility] Radiology Department (first floor) to check-in and test prep.  Please follow these instructions carefully (unless otherwise directed):  Hold all erectile dysfunction medications at least 3 days (72 hrs) prior to test.  On the Night Before the Test: . Be sure to Drink plenty of water. . Do not consume any caffeinated/decaffeinated beverages or chocolate 12 hours prior to your test. . Do not take any antihistamines 12 hours prior to your test.  On the Day of the Test: . Drink plenty of water. Do not drink any water within one hour of the test. . Do not eat any food 4 hours prior to the test. . You may take your regular medications prior to the test.  . Take metoprolol (Lopressor) 100 MG two hours prior to test. . HOLD Furosemide/Hydrochlorothiazide morning of the test.       After the Test: . Drink plenty of water. . After receiving IV contrast, you may experience a mild flushed feeling. This is normal. . On occasion, you may  experience a mild rash up to 24 hours after the test. This is not dangerous. If this occurs, you can take Benadryl 25 mg and increase your fluid intake. . If you experience trouble breathing, this can be serious. If it is severe call 911 IMMEDIATELY. If it is mild, please call our office. . If you take any of these medications: Glipizide/Metformin, Avandament, Glucavance, please do not take 48 hours after completing test unless otherwise instructed.   Once we have confirmed authorization from your insurance company, we will call you to set up a date and time for your test. Based on how quickly your insurance processes prior authorizations requests, please allow up to 4 weeks to be contacted for scheduling your Cardiac CT appointment. Be advised that routine Cardiac CT appointments could be scheduled as many as 8 weeks after your provider has ordered it.  For non-scheduling related questions, please contact the cardiac imaging nurse navigator should you have any questions/concerns: Rockwell Alexandria, Cardiac Imaging Nurse Navigator Mitzi Hansen, Interim Cardiac Imaging Nurse Navigator [facility] Direct Office Dial: [phone]   For scheduling needs, including cancellations and rescheduling, please call [place], [phone].    Follow-Up: At [facility], you and your health needs are our priority.  As part of our continuing mission to provide you with exceptional heart care, we have created designated Provider Care Teams.  These Care Teams include your primary Cardiologist (physician) and Advanced Practice Providers (APPs -  Physician Assistants and Nurse Practitioners) who all work together to  provide you with the care you need, when you need it.  We recommend signing up for the patient portal called "MyChart".  Sign up information is provided on this After Visit Summary.  MyChart is used to connect with patients for Virtual Visits (Telemedicine).  Patients are able to  view lab/test results, encounter notes, upcoming appointments, etc.  Non-urgent messages can be sent to your provider as well.   To learn more about what you can do with MyChart, go to [email or web address].    Your next appointment:   12 week(s)  The format for your next appointment:   In Person  Provider:   Kirk Ruths, MD

## 2020-05-02 NOTE — Telephone Encounter (Signed)
Enrolled patient for a 30 day [employer] Event Monitor to be mailed to patients home  

## 2020-05-23 ENCOUNTER — Telehealth (HOSPITAL_COMMUNITY): Payer: Self-pay | Admitting: Emergency Medicine

## 2020-05-23 NOTE — Telephone Encounter (Signed)
Attempted to call patient regarding upcoming cardiac CT appointment. °Left message on voicemail with name and callback number °Zamari Bonsall RN Navigator Cardiac Imaging °[facility] °[phone] Office °[phone] Cell ° °

## 2020-05-26 ENCOUNTER — Ambulatory Visit (HOSPITAL_COMMUNITY)
Admission: RE | Admit: 2020-05-26 | Discharge: 2020-05-26 | Disposition: A | Discharge status: Home / Self Care | Payer: Self-pay | Source: Ambulatory Visit | Attending: Cardiology | Admitting: Cardiology

## 2020-05-26 ENCOUNTER — Other Ambulatory Visit: Payer: Self-pay

## 2020-05-26 DIAGNOSIS — R072 Precordial pain: Secondary | ICD-10-CM | POA: Insufficient documentation

## 2020-05-26 MED ORDER — NITROGLYCERIN 0.4 MG SL SUBL
0.8000 mg | SUBLINGUAL_TABLET | Freq: Once | SUBLINGUAL | Status: AC | PRN
  Administered 2020-05-26: 0.8 mg via SUBLINGUAL

## 2020-05-26 MED ORDER — IOHEXOL 350 MG/ML SOLN
80.0000 mL | Freq: Once | INTRAVENOUS | Status: AC | PRN
  Administered 2020-05-26: 80 mL via INTRAVENOUS

## 2020-05-26 MED ORDER — NITROGLYCERIN 0.4 MG SL SUBL
SUBLINGUAL_TABLET | SUBLINGUAL | Status: AC
  Filled 2020-05-26: qty 2

## 2020-07-25 NOTE — Progress Notes (Signed)
HPI: FU atrial fibrillation.Patient apparently was to give blood with [facility] and was found to have an irregular heartbeat. An electrocardiogramSeptember 19, 2019 showed atrial fibrillation. AtFUOVpatient was in sinus rhythm. Echocardiogram May 2021 showed normal LV function, grade 1 diastolic dysfunction. Cardiac CTA 1/22 showed calcium score 0 and no CAD. Since last seen  patient denies dyspnea, exertional chest pain or syncope.  Occasional brief palpitations particularly at night.  Occasional sharp pain in his chest not related to exertion.  Current Outpatient Medications  Medication Sig Dispense Refill  . aspirin 81 MG tablet Take 1 tablet (81 mg total) by mouth daily.    . Melatonin 3 MG TABS Take 2 tablets by mouth at bedtime. As needed for sleep    . metoprolol succinate (TOPROL XL) 25 MG 24 hr tablet Take 1 tablet (25 mg total) by mouth daily. 90 tablet 3  . metoprolol tartrate (LOPRESSOR) 100 MG tablet TAKE 2 HOURS PRIOR TO CT SCAN 1 tablet 0   No current facility-administered medications for this visit.     Past Medical History:  Diagnosis Date  . ADD (attention deficit disorder) without hyperactivity   . Atrial fibrillation, persistent (HCC)   . Irregular heart beat   . Nicotine dependence   . Ocular migraine   . Pain in testicle   . Sleep disorder    shift work  . Testicular hypofunction     Past Surgical History:  Procedure Laterality Date  . none      Social History   Socioeconomic History  . Marital status: Single    Spouse name: Not on file  . Number of children: 1  . Years of education: Not on file  . Highest education level: Not on file  Occupational History  . Not on file  Tobacco Use  . Smoking status: Current Every Day Smoker    Types: E-cigarettes  . Smokeless tobacco: Never Used  Vaping Use  . Vaping Use: Every day  Substance and Sexual Activity  . Alcohol use: Yes    Comment: 6 glasses wine per week  . Drug use: Not Currently     Types: Marijuana  . Sexual activity: Yes  Other Topics Concern  . Not on file  Social History Narrative  . Not on file   Social Determinants of Health   Financial Resource Strain: Not on file  Food Insecurity: Not on file  Transportation Needs: Not on file  Physical Activity: Not on file  Stress: Not on file  Social Connections: Not on file  Intimate Partner Violence: Not on file    Family History  Problem Relation Age of Onset  . Hypertension Mother   . Hypertension Father   . Hypertension Maternal Grandmother   . Hypertension Maternal Grandfather     ROS: no fevers or chills, productive cough, hemoptysis, dysphasia, odynophagia, melena, hematochezia, dysuria, hematuria, rash, seizure activity, orthopnea, PND, pedal edema, claudication. Remaining systems are negative.  Physical Exam: Well-developed well-nourished in no acute distress.  Skin is warm and dry.  HEENT is normal.  Neck is supple.  Chest is clear to auscultation with normal expansion.  Cardiovascular exam is regular rate and rhythm.  Abdominal exam nontender or distended. No masses palpated. Extremities show no edema. neuro grossly intact  ECG-normal sinus rhythm at a rate of 88, no ST changes.  Personally reviewed  A/P  1 PAF-pt in sinus; continue toprol. CHADSvasc 0; will not anticoagulate.  2 chest pain-recent CTA shows no  CAD.  3 palpitations-will consider monitor in the future if symptoms worsen.  4 elevated BP reading-noted previously;  Improved. Continue to follow.  5 hepatic steatosis-noted on CT scan.  Previous LFTs elevated.  Patient does consume alcohol.  I asked him to decrease.  Recheck liver functions today and if elevated will ask him to follow-up with primary care or gastroenterology.  Olga Millers, MD

## 2020-08-01 ENCOUNTER — Other Ambulatory Visit: Payer: Self-pay

## 2020-08-01 ENCOUNTER — Encounter: Payer: Self-pay | Admitting: Cardiology

## 2020-08-01 ENCOUNTER — Ambulatory Visit (INDEPENDENT_AMBULATORY_CARE_PROVIDER_SITE_OTHER): Payer: Self-pay | Admitting: Cardiology

## 2020-08-01 VITALS — BP 132/84 | HR 88 | Ht 68.0 in | Wt 195.0 lb

## 2020-08-01 DIAGNOSIS — I480 Paroxysmal atrial fibrillation: Secondary | ICD-10-CM

## 2020-08-01 DIAGNOSIS — R002 Palpitations: Secondary | ICD-10-CM

## 2020-08-01 DIAGNOSIS — R072 Precordial pain: Secondary | ICD-10-CM

## 2020-08-01 LAB — HEPATIC FUNCTION PANEL
ALT: 97 IU/L — ABNORMAL HIGH (ref 0–44)
AST: 46 IU/L — ABNORMAL HIGH (ref 0–40)
Albumin: 4.6 g/dL (ref 4.0–5.0)
Alkaline Phosphatase: 101 IU/L (ref 44–121)
Bilirubin Total: 0.5 mg/dL (ref 0.0–1.2)
Bilirubin, Direct: 0.17 mg/dL (ref 0.00–0.40)
Total Protein: 7.2 g/dL (ref 6.0–8.5)

## 2020-08-01 NOTE — Patient Instructions (Signed)

## 2020-08-05 ENCOUNTER — Telehealth: Payer: Self-pay

## 2020-08-05 NOTE — Telephone Encounter (Addendum)
Tried calling patient to inform of results could not leave a voice message due to voicemail box is full. Will try calling patient again.  ----- Message from Lewayne Bunting, MD sent at 08/01/2020  5:41 PM EDT ----- Please ask pt to decrease ETOH use; needs fu with a primary care for elevated LFTs Roger Mitchell

## 2020-08-14 ENCOUNTER — Encounter: Payer: Self-pay | Admitting: *Deleted

## 2020-12-31 NOTE — Telephone Encounter (Signed)
Monitor was never worn. Order will be cancelled.  

## 2021-01-10 ENCOUNTER — Encounter (HOSPITAL_BASED_OUTPATIENT_CLINIC_OR_DEPARTMENT_OTHER): Payer: Self-pay | Admitting: Emergency Medicine

## 2021-01-10 ENCOUNTER — Other Ambulatory Visit: Payer: Self-pay

## 2021-01-10 ENCOUNTER — Emergency Department (HOSPITAL_BASED_OUTPATIENT_CLINIC_OR_DEPARTMENT_OTHER)
Admission: EM | Admit: 2021-01-10 | Discharge: 2021-01-10 | Disposition: A | Discharge status: Home / Self Care | Payer: BC Managed Care – PPO | Attending: Emergency Medicine | Admitting: Emergency Medicine

## 2021-01-10 DIAGNOSIS — F17290 Nicotine dependence, other tobacco product, uncomplicated: Secondary | ICD-10-CM | POA: Insufficient documentation

## 2021-01-10 DIAGNOSIS — Z7982 Long term (current) use of aspirin: Secondary | ICD-10-CM | POA: Insufficient documentation

## 2021-01-10 DIAGNOSIS — R002 Palpitations: Secondary | ICD-10-CM | POA: Diagnosis not present

## 2021-01-10 DIAGNOSIS — R112 Nausea with vomiting, unspecified: Secondary | ICD-10-CM | POA: Insufficient documentation

## 2021-01-10 LAB — CBC WITH DIFFERENTIAL/PLATELET
Abs Immature Granulocytes: 0.03 10*3/uL (ref 0.00–0.07)
Basophils Absolute: 0.1 10*3/uL (ref 0.0–0.1)
Basophils Relative: 1 %
Eosinophils Absolute: 0.4 10*3/uL (ref 0.0–0.5)
Eosinophils Relative: 6 %
HCT: 46.3 % (ref 39.0–52.0)
Hemoglobin: 16.8 g/dL (ref 13.0–17.0)
Immature Granulocytes: 1 %
Lymphocytes Relative: 34 %
Lymphs Abs: 2.1 10*3/uL (ref 0.7–4.0)
MCH: 31.8 pg (ref 26.0–34.0)
MCHC: 36.3 g/dL — ABNORMAL HIGH (ref 30.0–36.0)
MCV: 87.7 fL (ref 80.0–100.0)
Monocytes Absolute: 0.6 10*3/uL (ref 0.1–1.0)
Monocytes Relative: 10 %
Neutro Abs: 3.1 10*3/uL (ref 1.7–7.7)
Neutrophils Relative %: 48 %
Platelets: 259 10*3/uL (ref 150–400)
RBC: 5.28 MIL/uL (ref 4.22–5.81)
RDW: 11.6 % (ref 11.5–15.5)
WBC: 6.4 10*3/uL (ref 4.0–10.5)
nRBC: 0 % (ref 0.0–0.2)

## 2021-01-10 LAB — TROPONIN I (HIGH SENSITIVITY): Troponin I (High Sensitivity): 4 ng/L (ref <18)

## 2021-01-10 LAB — COMPREHENSIVE METABOLIC PANEL
ALT: 59 U/L — ABNORMAL HIGH (ref 0–44)
AST: 33 U/L (ref 15–41)
Albumin: 4.5 g/dL (ref 3.5–5.0)
Alkaline Phosphatase: 83 U/L (ref 38–126)
Anion gap: 8 (ref 5–15)
BUN: 13 mg/dL (ref 6–20)
CO2: 25 mmol/L (ref 22–32)
Calcium: 9.6 mg/dL (ref 8.9–10.3)
Chloride: 105 mmol/L (ref 98–111)
Creatinine, Ser: 1.45 mg/dL — ABNORMAL HIGH (ref 0.61–1.24)
GFR, Estimated: >60 mL/min (ref >60)
Glucose, Bld: 94 mg/dL (ref 70–99)
Potassium: 4 mmol/L (ref 3.5–5.1)
Sodium: 138 mmol/L (ref 135–145)
Total Bilirubin: 0.6 mg/dL (ref 0.3–1.2)
Total Protein: 7.6 g/dL (ref 6.5–8.1)

## 2021-01-10 NOTE — Discharge Instructions (Signed)
Please begin taking your metoprolol as prescribed today.  Call your cardiologist to schedule an appointment to discuss today's events further.  Return if symptoms worsen.

## 2021-01-10 NOTE — ED Provider Notes (Signed)
[facility] EMERGENCY DEPARTMENT Provider Note   CSN: [id number] Arrival date & time: 01/10/21  1441     History Chief Complaint  Patient presents with   Tachycardia    Roger Mitchell is a 40 y.o. male.  Patient with past medical history of atrial fibrillation presents today for palpitations.  Patient states that earlier today when he was driving he felt his heart start racing and had associated nausea and vomiting.  Symptoms lasted for approximately an hour, and have since resolved.  Patient states he was ordered episodes of atrial fibrillation.  He states he sees cardiology for same, given metoprolol which he states he takes "occasionally" however did take it last night.  No blood thinners.  Of note, patient endorses regular vaping and alcohol consumption.  Denies recreational drug use.  The history is provided by the patient. No language interpreter was used.      Past Medical History:  Diagnosis Date   ADD (attention deficit disorder) without hyperactivity    Atrial fibrillation, persistent (HCC)    Irregular heart beat    Nicotine dependence    Ocular migraine    Pain in testicle    Sleep disorder    shift work   Testicular hypofunction     Patient Active Problem List   Diagnosis Date Noted   Adult attention deficit disorder 10/20/2010   TESTICULAR HYPOFUNCTION 03/03/2009   BREAST MASS, LEFT 08/04/2006   SYMPTOM, POLYURIA 08/04/2006   PATELLO FEMORAL STRESS SYNDROME 06/23/2006    Past Surgical History:  Procedure Laterality Date   none         Family History  Problem Relation Age of Onset   Hypertension Mother    Hypertension Father    Hypertension Maternal Grandmother    Hypertension Maternal Grandfather     Social History   Tobacco Use   Smoking status: Every Day    Types: E-cigarettes   Smokeless tobacco: Never  Vaping Use   Vaping Use: Every day  Substance Use Topics   Alcohol use: Yes    Comment: beer 2-4 glasses per day    Drug use: Not Currently    Types: Marijuana    Home Medications Prior to Admission medications   Medication Sig Start Date End Date Taking? Authorizing Provider  aspirin 81 MG tablet Take 1 tablet (81 mg total) by mouth daily. 01/25/18   Lewayne Bunting, MD  Melatonin 3 MG TABS Take 2 tablets by mouth at bedtime. As needed for sleep    [provider]  metoprolol succinate (TOPROL XL) 25 MG 24 hr tablet Take 1 tablet (25 mg total) by mouth daily. 03/17/20   Lewayne Bunting, MD    Allergies    Patient has no known allergies.  Review of Systems   Review of Systems  Constitutional:  Negative for chills, fatigue and fever.  HENT:  Negative for congestion, postnasal drip, rhinorrhea, sore throat, trouble swallowing and voice change.   Respiratory:  Negative for cough and shortness of breath.   Cardiovascular:  Positive for palpitations. Negative for chest pain and leg swelling.  Gastrointestinal:  Positive for nausea and vomiting. Negative for abdominal distention, abdominal pain, constipation and diarrhea.  Neurological:  Negative for dizziness, tremors, seizures, syncope, facial asymmetry, speech difficulty, weakness, light-headedness, numbness and headaches.  Psychiatric/Behavioral:  Negative for confusion and decreased concentration.   All other systems reviewed and are negative.  Physical Exam Updated Vital Signs BP (!) 140/91 (BP Location: Right Arm)  Pulse 72   Temp 98.6 F (37 C) (Oral)   Resp 18   Ht 5' 8.5" (1.74 m)   Wt 86.2 kg   SpO2 99%   BMI 28.47 kg/m   Physical Exam Vitals and nursing note reviewed.  Constitutional:      General: He is not in acute distress.    Appearance: Normal appearance. He is normal weight. He is not ill-appearing, toxic-appearing or diaphoretic.  HENT:     Head: Normocephalic and atraumatic.  Eyes:     General: No scleral icterus.    Conjunctiva/sclera: Conjunctivae normal.  Cardiovascular:     Rate and Rhythm: Normal  rate and regular rhythm.     Pulses: Normal pulses.          Radial pulses are 2+ on the right side and 2+ on the left side.     Heart sounds: Normal heart sounds.  Pulmonary:     Effort: Pulmonary effort is normal. No respiratory distress.     Breath sounds: Normal breath sounds.  Abdominal:     General: Abdomen is flat.     Palpations: Abdomen is soft.  Musculoskeletal:        General: Normal range of motion.     Cervical back: Normal range of motion.     Right lower leg: No edema.     Left lower leg: No edema.  Skin:    General: Skin is warm and dry.  Neurological:     General: No focal deficit present.     Mental Status: He is alert.  Psychiatric:        Mood and Affect: Mood normal.        Behavior: Behavior normal.    ED Results / Procedures / Treatments   Labs (all labs ordered are listed, but only abnormal results are displayed) Labs Reviewed  COMPREHENSIVE METABOLIC PANEL - Abnormal; Notable for the following components:      Result Value   Creatinine, Ser 1.45 (*)    ALT 59 (*)    All other components within normal limits  CBC WITH DIFFERENTIAL/PLATELET - Abnormal; Notable for the following components:   MCHC 36.3 (*)    All other components within normal limits  TROPONIN I (HIGH SENSITIVITY)  TROPONIN I (HIGH SENSITIVITY)    EKG EKG Interpretation  Date/Time:  Saturday January 10 2021 14:49:16 EDT Ventricular Rate:  96 PR Interval:  142 QRS Duration: 86 QT Interval:  326 QTC Calculation: 411 R Axis:   77 Text Interpretation: Sinus rhythm with marked sinus arrhythmia Otherwise normal ECG Since last tracing Rate faster Confirmed by Susy Frizzle [phone]) on 01/10/2021 2:53:14 PM  Radiology No results found.  Procedures Procedures   Medications Ordered in ED Medications - No data to display  ED Course  I have reviewed the triage vital signs and the nursing notes.  Pertinent labs & imaging results that were available during my care of the  patient were reviewed by me and considered in my medical decision making (see chart for details).    MDM Rules/Calculators/A&P                         Patient presents with complaint of palpitations, nausea and vomiting here today.  History of same, patient has known A. fib for which he takes metoprolol.  He is not anticoagulated at this time.  CHA2DS2-VASc score remains 1 at this time, no indication for additional anticoagulation.  Patient is no  longer having chest pain, no changes in EKG, troponin is negative, second troponin not indicated.  Other labs unremarkable at this time. Patient has regular cardiology follow-up for A. fib.  Will advise for patient to make an appointment with cardiology regarding her symptoms today.  Patient also states that he does not always take his metoprolol due to forgetting.  Educated patient on the importance of taking this medication, he is understanding of this.  Patient is no longer having symptoms, low suspicion for ACS at this time.  Patient is also PERC negative.  He is afebrile, nontoxic-appearing, and in no acute distress.  Plan to discharge patient with close cardiology follow-up.  Patient is amenable with plan for discharge and educated on red flag symptoms that would prompt return.   Final Clinical Impression(s) / ED Diagnoses Final diagnoses:  Palpitations    Rx / DC Orders ED Discharge Orders     None     An After Visit Summary was printed and given to the patient.    Vear Clock 01/10/21 1830    Terrilee Files, MD 01/11/21 1024

## 2021-01-10 NOTE — ED Triage Notes (Signed)
Pt reports increased HR x about 1 hr; c/o nausea, diarrhea around the same time

## 2021-02-16 ENCOUNTER — Ambulatory Visit (INDEPENDENT_AMBULATORY_CARE_PROVIDER_SITE_OTHER): Payer: BC Managed Care – PPO | Admitting: Family Medicine

## 2021-02-16 ENCOUNTER — Encounter: Payer: Self-pay | Admitting: Family Medicine

## 2021-02-16 ENCOUNTER — Other Ambulatory Visit: Payer: Self-pay

## 2021-02-16 VITALS — BP 126/74 | HR 75 | Temp 97.0°F | Ht 68.0 in | Wt 192.2 lb

## 2021-02-16 DIAGNOSIS — F1010 Alcohol abuse, uncomplicated: Secondary | ICD-10-CM

## 2021-02-16 DIAGNOSIS — E291 Testicular hypofunction: Secondary | ICD-10-CM

## 2021-02-16 DIAGNOSIS — Z0000 Encounter for general adult medical examination without abnormal findings: Secondary | ICD-10-CM

## 2021-02-16 DIAGNOSIS — R7989 Other specified abnormal findings of blood chemistry: Secondary | ICD-10-CM

## 2021-02-16 NOTE — Progress Notes (Signed)
New Patient Office Visit  Subjective:  Patient ID: Roger Mitchell, male    DOB: July 14, 1980  Age: 40 y.o. MRN: [id number]  CC:  Chief Complaint  Patient presents with   Establish Care    NP/establish care, concerns about elevated AST levels.     HPI Roger Mitchell presents for establishment of care.  Significant past medical history of alcohol use disorder, paroxysmal atrial fib, fatty liver disease, elevated liver enzymes(diagnosed with CT).  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  He is down to 2 alcoholic drinks daily and hopes to stop entirely.  He has not been to a formal treatment facility.  He has been to a couple of AA meetings but was not sure that it was for him.  He is aware that atrial for up and fatty liver are associated with alcohol abuse.  He has been at a new job for 6 months.  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  He had been depressed before he left his other job.  His 32 year old daughter is with him intermittently.  Past Medical History:  Diagnosis Date   ADD (attention deficit disorder) without hyperactivity    Atrial fibrillation, persistent (HCC)    Irregular heart beat    Nicotine dependence    Ocular migraine    Pain in testicle    Sleep disorder    shift work   Testicular hypofunction     Past Surgical History:  Procedure Laterality Date   none      Family History  Problem Relation Age of Onset   Healthy Mother    Hypertension Father    Hypertension Maternal Grandmother    Hypertension Maternal Grandfather     Social History   Socioeconomic History   Marital status: Single    Spouse name: Not on file   Number of children: 1   Years of education: Not on file   Highest education level: Not on file  Occupational History   Not on file  Tobacco Use   Smoking status: Every Day    Types: E-cigarettes   Smokeless tobacco: Never  Vaping Use   Vaping Use: Every day  Substance and  Sexual Activity   Alcohol use: Yes    Comment: beer 2-4 glasses per day   Drug use: Not Currently    Types: Marijuana   Sexual activity: Yes  Other Topics Concern   Not on file  Social History Narrative   Not on file   Social Determinants of Health   Financial Resource Strain: Not on file  Food Insecurity: Not on file  Transportation Needs: Not on file  Physical Activity: Not on file  Stress: Not on file  Social Connections: Not on file  Intimate Partner Violence: Not on file    ROS Review of Systems  Constitutional:  Negative for chills, diaphoresis, fatigue, fever and unexpected weight change.  Eyes:  Negative for photophobia and visual disturbance.  Respiratory: Negative.    Cardiovascular: Negative.   Gastrointestinal: Negative.   Genitourinary: Negative.   Musculoskeletal: Negative.   Neurological:  Negative for facial asymmetry, speech difficulty and weakness.  Depression screen [facility] 2/9 02/16/2021 02/16/2021  Decreased Interest 1 0  Down, Depressed, Hopeless 0 0  PHQ - 2 Score 1 0     Objective:   Today's Vitals: BP 126/74 (BP Location: Right Arm, Patient Position: Sitting, Cuff Size: Large)   Pulse 75   Temp (!) 97  F (36.1 C) (Temporal)   Ht 5\' 8"  (1.727 m)   Wt 192 lb 3.2 oz (87.2 kg)   SpO2 98%   BMI 29.22 kg/m   Physical Exam Vitals and nursing note reviewed.  Constitutional:      General: He is not in acute distress.    Appearance: Normal appearance. He is normal weight. He is not ill-appearing, toxic-appearing or diaphoretic.  HENT:     Head: Normocephalic and atraumatic.     Right Ear: Tympanic membrane, ear canal and external ear normal.     Left Ear: Tympanic membrane, ear canal and external ear normal.     Mouth/Throat:     Mouth: Mucous membranes are moist.     Pharynx: Oropharynx is clear. No oropharyngeal exudate or posterior oropharyngeal erythema.  Eyes:     General: No scleral icterus.       Right eye: No discharge.        Left  eye: No discharge.     Extraocular Movements: Extraocular movements intact.     Conjunctiva/sclera: Conjunctivae normal.     Pupils: Pupils are equal, round, and reactive to light.  Cardiovascular:     Rate and Rhythm: Normal rate and regular rhythm.  Pulmonary:     Effort: Pulmonary effort is normal.     Breath sounds: Normal breath sounds.  Musculoskeletal:     Cervical back: No rigidity or tenderness.  Lymphadenopathy:     Cervical: No cervical adenopathy.  Skin:    General: Skin is warm and dry.  Neurological:     Mental Status: He is alert and oriented to person, place, and time.  Psychiatric:        Mood and Affect: Mood normal.        Behavior: Behavior normal.    Assessment & Plan:   Problem List Items Addressed This Visit   None Visit Diagnoses     Androgen deficiency    -  Primary   Relevant Orders   Testosterone Total,Free,Bio, Males-([facility])   Alcohol abuse       Relevant Orders   CBC   Hepatic function panel   Elevated LFTs       Relevant Orders   Hepatic function panel   Hepatitis C antibody   Hepatitis B Surface AntiGEN   Healthcare maintenance       Relevant Orders   Basic metabolic panel   CBC   Lipid panel   Urinalysis, Routine w reflex microscopic       Outpatient Encounter Medications as of 02/16/2021  Medication Sig   Melatonin 3 MG TABS Take 2 tablets by mouth at bedtime. As needed for sleep   metoprolol succinate (TOPROL XL) 25 MG 24 hr tablet Take 1 tablet (25 mg total) by mouth daily.   naltrexone (DEPADE) 50 MG tablet Take 50 mg by mouth daily.   [DISCONTINUED] aspirin 81 MG tablet Take 1 tablet (81 mg total) by mouth daily. (Patient not taking: Reported on 02/16/2021)   No facility-administered encounter medications on file as of 02/16/2021.    Follow-up: Return in about 3 months (around 05/19/2021), or 05/21/2021 for local AA meetings., for Return in 3 months for a physical exam..  Return fasting for above ordered blood work.  Suggested that he give AA another try. Encouraged him to stop drinking.  [email or web address], MD

## 2021-02-20 ENCOUNTER — Other Ambulatory Visit: Payer: BC Managed Care – PPO

## 2021-02-20 NOTE — Addendum Note (Signed)
Addended by: Varney Biles on: 02/20/2021 03:09 PM   Modules accepted: Orders

## 2021-02-23 ENCOUNTER — Other Ambulatory Visit: Payer: Self-pay

## 2021-02-23 ENCOUNTER — Other Ambulatory Visit (INDEPENDENT_AMBULATORY_CARE_PROVIDER_SITE_OTHER): Payer: BC Managed Care – PPO

## 2021-02-23 DIAGNOSIS — R7989 Other specified abnormal findings of blood chemistry: Secondary | ICD-10-CM

## 2021-02-23 DIAGNOSIS — E291 Testicular hypofunction: Secondary | ICD-10-CM

## 2021-02-23 DIAGNOSIS — Z0000 Encounter for general adult medical examination without abnormal findings: Secondary | ICD-10-CM | POA: Diagnosis not present

## 2021-02-23 DIAGNOSIS — F1010 Alcohol abuse, uncomplicated: Secondary | ICD-10-CM

## 2021-02-23 LAB — CBC
HCT: 48.9 % (ref 39.0–52.0)
Hemoglobin: 16.4 g/dL (ref 13.0–17.0)
MCHC: 33.6 g/dL (ref 30.0–36.0)
MCV: 93.1 fl (ref 78.0–100.0)
Platelets: 226 10*3/uL (ref 150.0–400.0)
RBC: 5.26 Mil/uL (ref 4.22–5.81)
RDW: 12.5 % (ref 11.5–15.5)
WBC: 5.4 10*3/uL (ref 4.0–10.5)

## 2021-02-23 LAB — URINALYSIS, ROUTINE W REFLEX MICROSCOPIC
Bilirubin Urine: NEGATIVE
Hgb urine dipstick: NEGATIVE
Ketones, ur: NEGATIVE
Leukocytes,Ua: NEGATIVE
Nitrite: NEGATIVE
RBC / HPF: NONE SEEN (ref 0–?)
Specific Gravity, Urine: 1.025 (ref 1.000–1.030)
Total Protein, Urine: NEGATIVE
Urine Glucose: NEGATIVE
Urobilinogen, UA: 0.2 (ref 0.0–1.0)
WBC, UA: NONE SEEN (ref 0–?)
pH: 6 (ref 5.0–8.0)

## 2021-02-23 LAB — HEPATIC FUNCTION PANEL
ALT: 48 U/L (ref 0–53)
AST: 24 U/L (ref 0–37)
Albumin: 4.7 g/dL (ref 3.5–5.2)
Alkaline Phosphatase: 86 U/L (ref 39–117)
Bilirubin, Direct: 0.1 mg/dL (ref 0.0–0.3)
Total Bilirubin: 0.8 mg/dL (ref 0.2–1.2)
Total Protein: 7.3 g/dL (ref 6.0–8.3)

## 2021-02-23 LAB — LIPID PANEL
Cholesterol: 204 mg/dL — ABNORMAL HIGH (ref 0–200)
HDL: 60.5 mg/dL (ref >39.00)
NonHDL: 143.83
Total CHOL/HDL Ratio: 3
Triglycerides: 219 mg/dL — ABNORMAL HIGH (ref 0.0–149.0)
VLDL: 43.8 mg/dL — ABNORMAL HIGH (ref 0.0–40.0)

## 2021-02-23 LAB — BASIC METABOLIC PANEL
BUN: 12 mg/dL (ref 6–23)
CO2: 28 mEq/L (ref 19–32)
Calcium: 9.6 mg/dL (ref 8.4–10.5)
Chloride: 102 mEq/L (ref 96–112)
Creatinine, Ser: 1.31 mg/dL (ref 0.40–1.50)
GFR: 68.38 mL/min (ref >60.00)
Glucose, Bld: 90 mg/dL (ref 70–99)
Potassium: 4.1 mEq/L (ref 3.5–5.1)
Sodium: 139 mEq/L (ref 135–145)

## 2021-02-23 LAB — LDL CHOLESTEROL, DIRECT: Direct LDL: 126 mg/dL

## 2021-02-23 NOTE — Addendum Note (Signed)
Addended by: Varney Biles on: 02/23/2021 07:37 AM   Modules accepted: Orders

## 2021-02-23 NOTE — Addendum Note (Signed)
Addended by: Varney Biles on: 02/23/2021 07:36 AM   Modules accepted: Orders

## 2021-02-24 LAB — TESTOSTERONE TOTAL,FREE,BIO, MALES
Albumin: 4.7 g/dL (ref 3.6–5.1)
Sex Hormone Binding: 18 nmol/L (ref 10–50)
Testosterone, Bioavailable: 124.7 ng/dL (ref 110.0–575.0)
Testosterone, Free: 58.2 pg/mL (ref 46.0–224.0)
Testosterone: 299 ng/dL (ref 250–827)

## 2021-02-24 LAB — HEPATITIS C ANTIBODY
Hepatitis C Ab: NONREACTIVE
SIGNAL TO CUT-OFF: 0.03 (ref <1.00)

## 2021-02-24 LAB — HEPATITIS B SURFACE ANTIGEN: Hepatitis B Surface Ag: NONREACTIVE

## 2021-04-06 IMAGING — DX DG CHEST 1V PORT
1 series · 1 of 1 positions shown · non-contrast
Comparison: 12/15/2018 chest radiograph

CLINICAL DATA: Acute chest pain.

EXAM:
PORTABLE CHEST 1 VIEW

[chest ap]
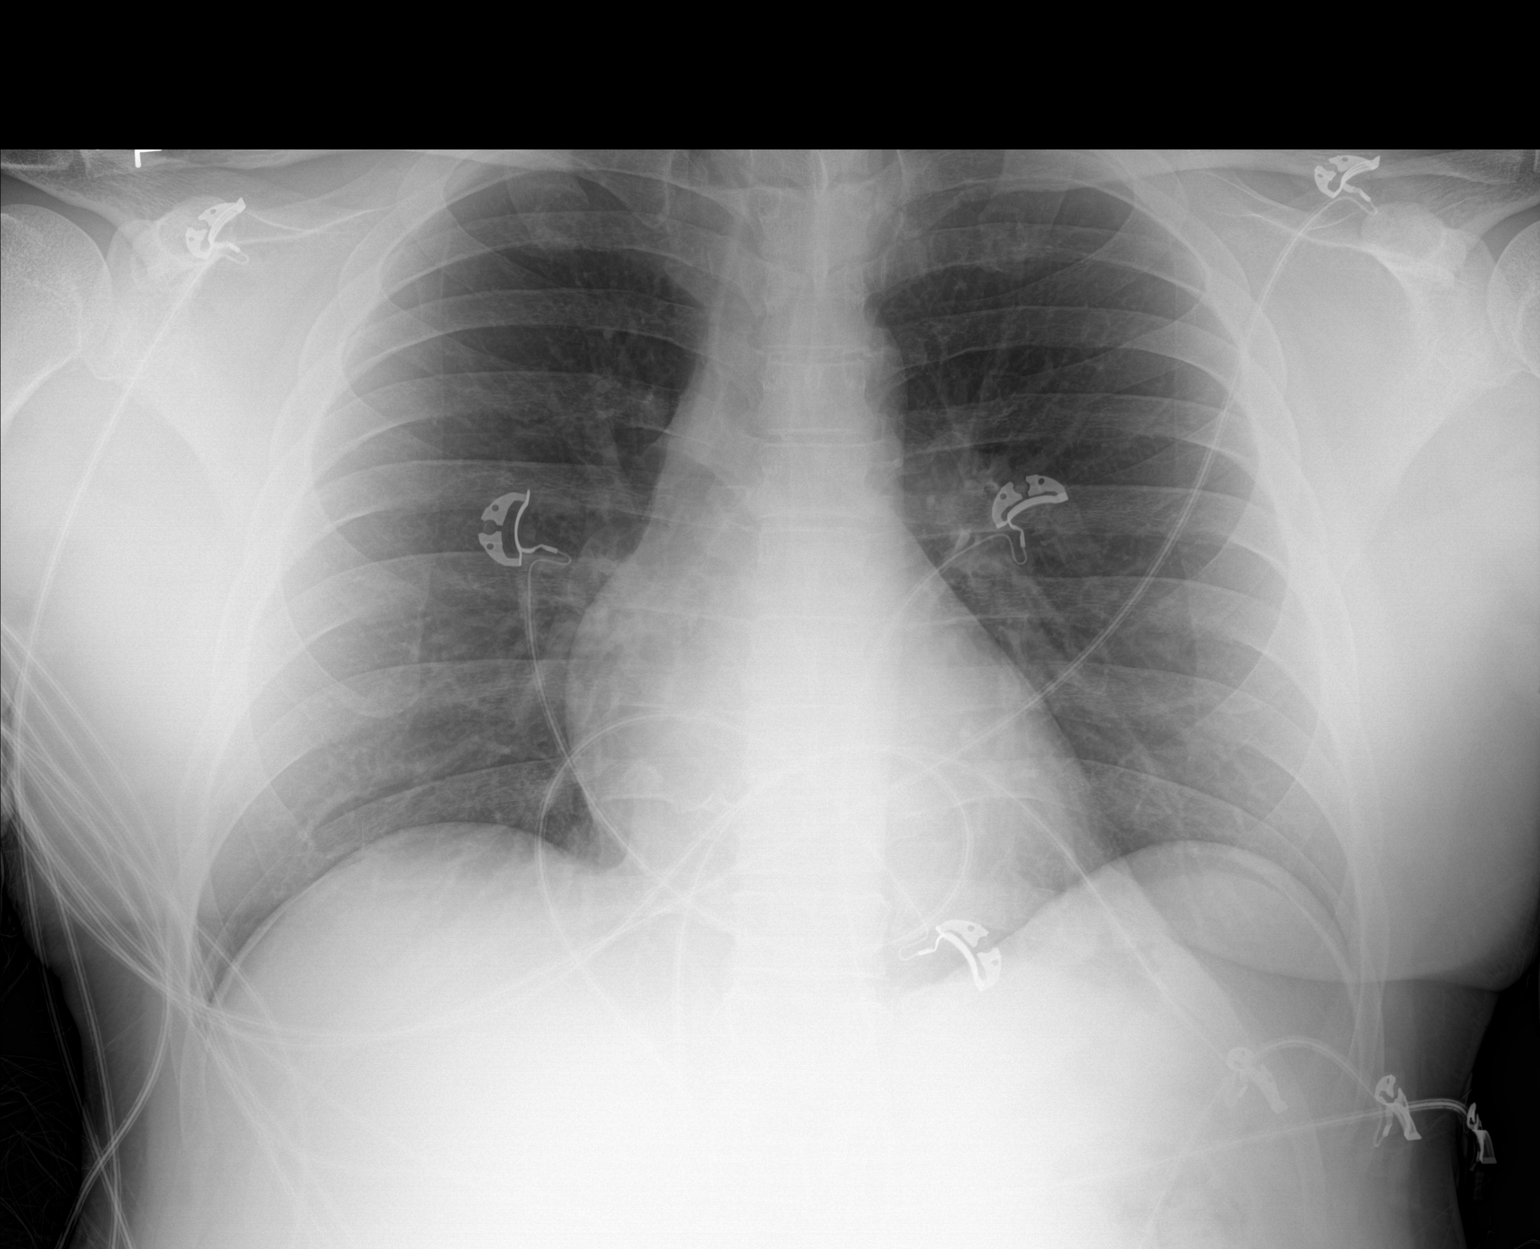

[1 of 1 positions shown; findings below may reference images not displayed]

FINDINGS: The cardiomediastinal silhouette is unremarkable.

There is no evidence of focal airspace disease, pulmonary edema,
suspicious pulmonary nodule/mass, pleural effusion, or pneumothorax.

No acute bony abnormalities are identified.
IMPRESSION: No active disease.

## 2021-05-19 ENCOUNTER — Telehealth: Payer: Self-pay | Admitting: Family Medicine

## 2021-05-19 ENCOUNTER — Encounter: Payer: BC Managed Care – PPO | Admitting: Family Medicine

## 2021-05-19 NOTE — Telephone Encounter (Signed)
Pt missed his appointment today 05/19/21.  He was in traffic coming from [facility].

## 2021-05-21 ENCOUNTER — Encounter: Payer: Self-pay | Admitting: Family Medicine

## 2021-05-21 NOTE — Telephone Encounter (Signed)
pt called noting would arrive late and coming from [place], 2nd late arrival/late cancellation - no charge generated, letter mailed

## 2021-05-25 ENCOUNTER — Encounter: Payer: Self-pay | Admitting: Family Medicine

## 2021-05-25 ENCOUNTER — Other Ambulatory Visit: Payer: Self-pay

## 2021-05-25 ENCOUNTER — Ambulatory Visit (INDEPENDENT_AMBULATORY_CARE_PROVIDER_SITE_OTHER): Payer: BC Managed Care – PPO | Admitting: Family Medicine

## 2021-05-25 VITALS — BP 124/74 | HR 70 | Temp 97.0°F | Ht 68.0 in | Wt 189.4 lb

## 2021-05-25 DIAGNOSIS — Z114 Encounter for screening for human immunodeficiency virus [HIV]: Secondary | ICD-10-CM | POA: Diagnosis not present

## 2021-05-25 DIAGNOSIS — F1010 Alcohol abuse, uncomplicated: Secondary | ICD-10-CM

## 2021-05-25 DIAGNOSIS — Z0000 Encounter for general adult medical examination without abnormal findings: Secondary | ICD-10-CM | POA: Diagnosis not present

## 2021-05-25 NOTE — Progress Notes (Signed)
Established Patient Office Visit  Subjective:  Patient ID: Roger Mitchell, male    DOB: Mar 16, 1981  Age: 41 y.o. MRN: [id number]  CC:  Chief Complaint  Patient presents with   Annual Exam    CPE, no concerns. Would like labs and testosterone levels checked.     HPI Roger Mitchell presents for a physical exam.  He has been doing well.  Working out in [employer].  Continues to take naltrexone for alcohol use disorder.  Continues to have a few beers almost daily with days of sobriety intermixed.  Has not been able to make it to [place] yet.  Feels better.  Concerned about testosterone level measured a few months ago.  Lives alone.  Shares joint custody of his daughter.  Past Medical History:  Diagnosis Date   ADD (attention deficit disorder) without hyperactivity    Atrial fibrillation, persistent (HCC)    Irregular heart beat    Nicotine dependence    Ocular migraine    Pain in testicle    Sleep disorder    shift work   Testicular hypofunction     Past Surgical History:  Procedure Laterality Date   none      Family History  Problem Relation Age of Onset   Healthy Mother    Hypertension Father    Hypertension Maternal Grandmother    Hypertension Maternal Grandfather     Social History   Socioeconomic History   Marital status: Single    Spouse name: Not on file   Number of children: 1   Years of education: Not on file   Highest education level: Not on file  Occupational History   Not on file  Tobacco Use   Smoking status: Every Day    Types: E-cigarettes   Smokeless tobacco: Never  Vaping Use   Vaping Use: Every day  Substance and Sexual Activity   Alcohol use: Yes    Comment: beer 2-4 glasses per day   Drug use: Not Currently    Types: Marijuana   Sexual activity: Yes  Other Topics Concern   Not on file  Social History Narrative   Not on file   Social Determinants of Health   Financial Resource Strain: Not on file  Food Insecurity: Not on file   Transportation Needs: Not on file  Physical Activity: Not on file  Stress: Not on file  Social Connections: Not on file  Intimate Partner Violence: Not on file    Outpatient Medications Prior to Visit  Medication Sig Dispense Refill   Melatonin 3 MG TABS Take 2 tablets by mouth at bedtime. As needed for sleep     metoprolol succinate (TOPROL XL) 25 MG 24 hr tablet Take 1 tablet (25 mg total) by mouth daily. 90 tablet 3   naltrexone (DEPADE) 50 MG tablet Take 50 mg by mouth daily.     No facility-administered medications prior to visit.    No Known Allergies  ROS Review of Systems  Constitutional:  Negative for diaphoresis, fatigue, fever and unexpected weight change.  HENT: Negative.    Eyes:  Negative for photophobia and visual disturbance.  Respiratory: Negative.    Cardiovascular: Negative.   Gastrointestinal: Negative.   Genitourinary: Negative.   Musculoskeletal:  Negative for gait problem and joint swelling.  Skin:  Negative for color change and pallor.  Neurological:  Negative for speech difficulty and weakness.  Psychiatric/Behavioral: Negative.       Objective:    Physical Exam Vitals  and nursing note reviewed.  Constitutional:      General: He is not in acute distress.    Appearance: Normal appearance. He is not ill-appearing, toxic-appearing or diaphoretic.  HENT:     Head: Normocephalic and atraumatic.     Right Ear: Tympanic membrane, ear canal and external ear normal.     Left Ear: Tympanic membrane, ear canal and external ear normal.     Mouth/Throat:     Mouth: Mucous membranes are moist.     Pharynx: Oropharynx is clear. No oropharyngeal exudate or posterior oropharyngeal erythema.  Eyes:     General: No scleral icterus.       Right eye: No discharge.        Left eye: No discharge.     Extraocular Movements: Extraocular movements intact.     Conjunctiva/sclera: Conjunctivae normal.     Pupils: Pupils are equal, round, and reactive to light.   Cardiovascular:     Rate and Rhythm: Normal rate and regular rhythm.  Pulmonary:     Effort: Pulmonary effort is normal.     Breath sounds: Normal breath sounds.  Abdominal:     General: Abdomen is flat. Bowel sounds are normal.     Palpations: Abdomen is soft.     Hernia: There is no hernia in the left inguinal area or right inguinal area.  Genitourinary:    Penis: Normal and circumcised. No hypospadias, erythema, tenderness, discharge, swelling or lesions.      Testes:        Right: Mass, tenderness or swelling not present. Right testis is descended.        Left: Mass, tenderness or swelling not present. Left testis is descended.     Epididymis:     Right: Not inflamed or enlarged.     Left: Not inflamed or enlarged.  Musculoskeletal:     Cervical back: No rigidity or tenderness.  Lymphadenopathy:     Cervical: No cervical adenopathy.     Lower Body: No right inguinal adenopathy. No left inguinal adenopathy.  Skin:    General: Skin is warm and dry.  Neurological:     Mental Status: He is alert and oriented to person, place, and time.  Psychiatric:        Mood and Affect: Mood normal.        Behavior: Behavior normal.    BP 124/74 (BP Location: Left Arm, Patient Position: Sitting, Cuff Size: Normal)    Pulse 70    Temp (!) 97 F (36.1 C) (Temporal)    Ht 5\' 8"  (1.727 m)    Wt 189 lb 6.4 oz (85.9 kg)    SpO2 98%    BMI 28.80 kg/m  Wt Readings from Last 3 Encounters:  05/25/21 189 lb 6.4 oz (85.9 kg)  02/16/21 192 lb 3.2 oz (87.2 kg)  01/10/21 190 lb (86.2 kg)     Health Maintenance Due  Topic Date Due   TETANUS/TDAP  Never done    There are no preventive care reminders to display for this patient.  Lab Results  Component Value Date   TSH 1.780 01/25/2018   Lab Results  Component Value Date   WBC 5.4 02/23/2021   HGB 16.4 02/23/2021   HCT 48.9 02/23/2021   MCV 93.1 02/23/2021   PLT 226.0 02/23/2021   Lab Results  Component Value Date   NA 139 02/23/2021    K 4.1 02/23/2021   CO2 28 02/23/2021   GLUCOSE 90 02/23/2021   BUN  12 02/23/2021   CREATININE 1.31 02/23/2021   BILITOT 0.8 02/23/2021   ALKPHOS 86 02/23/2021   AST 24 02/23/2021   ALT 48 02/23/2021   PROT 7.3 02/23/2021   ALBUMIN 4.7 02/23/2021   CALCIUM 9.6 02/23/2021   ANIONGAP 8 01/10/2021   GFR 68.38 02/23/2021   Lab Results  Component Value Date   CHOL 204 (H) 02/23/2021   Lab Results  Component Value Date   HDL 60.50 02/23/2021   Lab Results  Component Value Date   LDLCALC 90 08/04/2006   Lab Results  Component Value Date   TRIG 219.0 (H) 02/23/2021   Lab Results  Component Value Date   CHOLHDL 3 02/23/2021   No results found for: HGBA1C    Assessment & Plan:   Problem List Items Addressed This Visit       Other   Healthcare maintenance   Alcohol abuse - Primary   Relevant Orders   Hepatic function panel    No orders of the defined types were placed in this encounter.   Follow-up: Return in about 6 months (around 11/22/2021), or Look up [employer]. Call [facility] for out patient treatment opportunities..  Encouraged involvement with AA.  Encouraged abstinence from alcohol and other mood altering substances.  Rechecking hepatic function testing.  Explained that his testosterone levels were suppressed with alcohol use.  Information was given on health maintenance and disease prevention.  Continue working gym.  Libby Maw, MD

## 2021-05-26 LAB — HEPATIC FUNCTION PANEL
ALT: 29 U/L (ref 0–53)
AST: 19 U/L (ref 0–37)
Albumin: 4.4 g/dL (ref 3.5–5.2)
Alkaline Phosphatase: 66 U/L (ref 39–117)
Bilirubin, Direct: 0.1 mg/dL (ref 0.0–0.3)
Total Bilirubin: 0.7 mg/dL (ref 0.2–1.2)
Total Protein: 6.8 g/dL (ref 6.0–8.3)

## 2021-05-26 LAB — HIV ANTIBODY (ROUTINE TESTING W REFLEX): HIV 1&2 Ab, 4th Generation: NONREACTIVE

## 2021-06-17 IMAGING — CT CT HEART MORP W/ CTA COR W/ SCORE W/ CA W/CM &/OR W/O CM
4 of 7 series · 8 of 20 positions shown, 9 images · IV contrast (APPLIED)
Comparison: 03/15/2020 chest radiograph.
COMPARISON: 03/15/2020 chest radiograph.

Addendum:
EXAM:
OVER-READ INTERPRETATION  CT CHEST

The following report is an over-read performed by radiologist Dr.
Aude Busch [REDACTED] on 05/26/2020. This over-read
does not include interpretation of cardiac or coronary anatomy or
pathology. The coronary CTA interpretation by the cardiologist is
attached.
CLINICAL DATA: 39 yo male with chest pain
Cardiac/Coronary  CT
TECHNIQUE: The patient was scanned on a [employer] Force scanner.

[Series 8: best diast 75 % · axial · 0.39mm/px · z∈[+1245,+1284]mm · 2 of 291 slices shown]
[im 97/291  vessel]
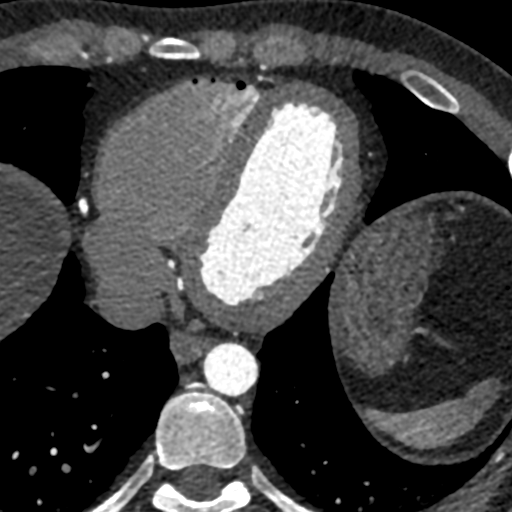
[im 194/291  vessel]
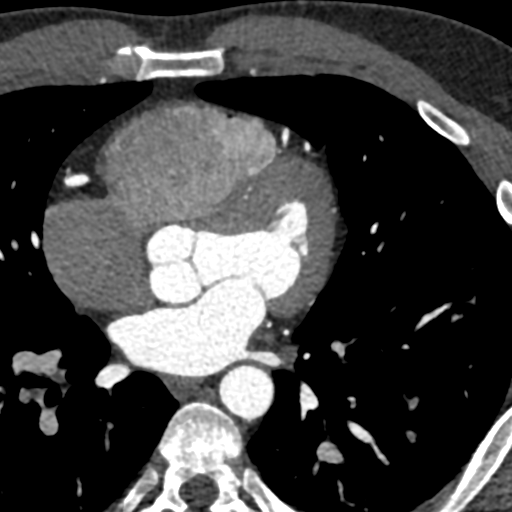

[Series 9: best syst · axial · 0.39mm/px · z∈[+1245,+1284]mm · 2 of 291 slices shown, 3 images]
[im 97/291  vessel]
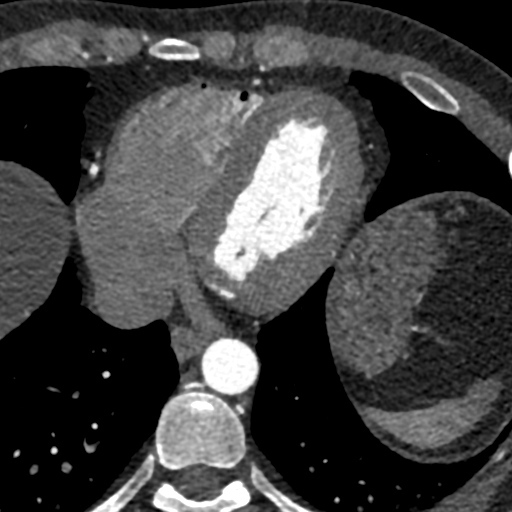
[im 97/291  lung]
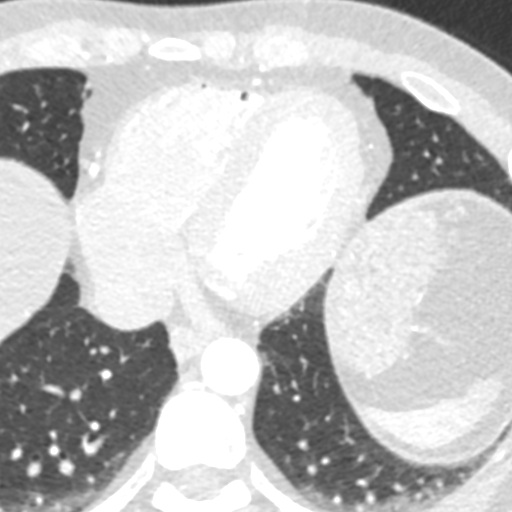
[im 194/291  vessel]
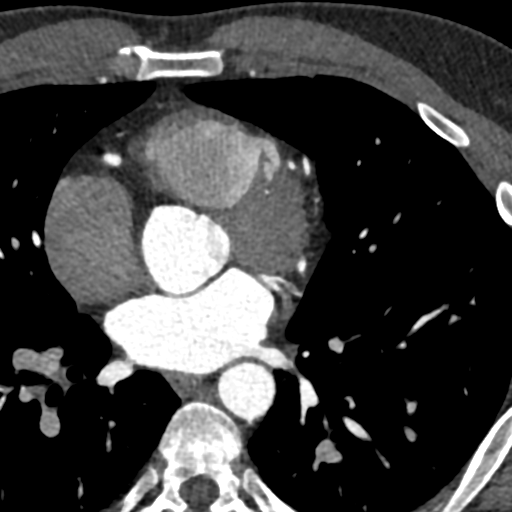

[Series 10: ts diast sharp 75 % · axial · 0.39mm/px · z∈[+1245,+1284]mm · 2 of 291 slices shown]
[im 97/291  lung]
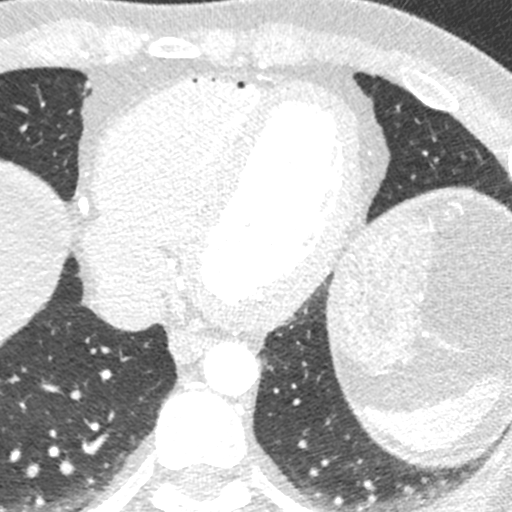
[im 194/291  lung]
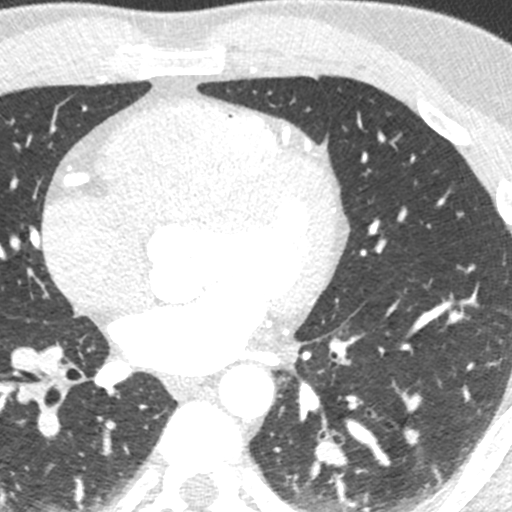

[Series 11: ts syst sharp · axial · 0.39mm/px · z∈[+1245,+1284]mm · 2 of 291 slices shown]
[im 97/291  lung]
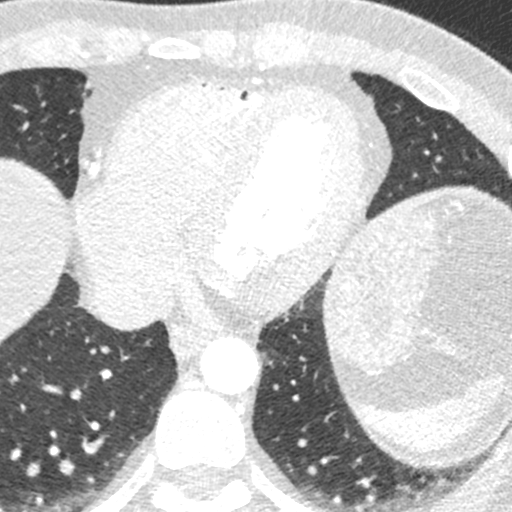
[im 194/291  lung]
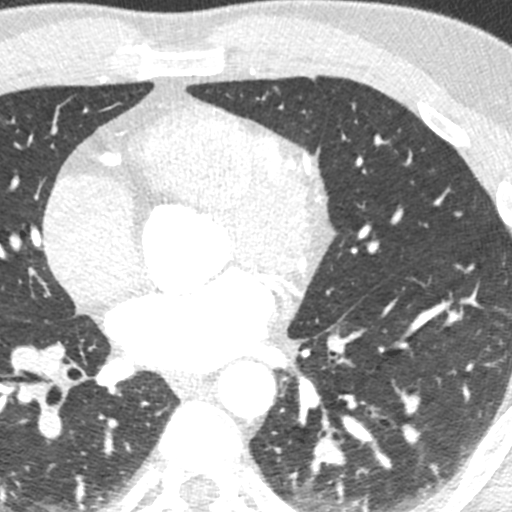

[8 of 20 positions shown; findings below may reference images not displayed]

FINDINGS: Vascular: Normal aortic caliber. No central pulmonary embolism, on
this non-dedicated study.

Mediastinum/Nodes: No imaged thoracic adenopathy.

Lungs/Pleura: No pleural fluid.  Clear imaged lungs.

Upper Abdomen: Marked hepatic steatosis. Normal imaged portions of
the spleen, stomach.

Musculoskeletal: No acute osseous abnormality.
IMPRESSION: 1.  No acute findings in the imaged extracardiac chest.
2. Marked hepatic steatosis.
FINDINGS: A 120 kV prospective scan was triggered in the descending thoracic
aorta at 111 HU's. Axial non-contrast 3 mm slices were carried out
through the heart. The data set was analyzed on a dedicated work
station and scored using the Agatson method. Gantry rotation speed
was 250 msecs and collimation was .6 mm. No beta blockade and 0.8 mg
of sl NTG was given. The 3D data set was reconstructed in 5%
intervals of the 67-82 % of the R-R cycle. Diastolic phases were
analyzed on a dedicated work station using MPR, MIP and VRT modes.
The patient received 80 cc of contrast.

Aorta:  Normal size.  No calcifications.  No dissection.

Aortic Valve:  Trileaflet.  No calcifications.

Coronary Arteries:  Normal coronary origin.  Right dominance.

RCA is a large dominant artery that gives rise to PDA and PLVB.
There is no plaque.

Left main is a large artery that gives rise to LAD and LCX arteries.

LAD is a large vessel that gives rise to small D1 and large D2;
there is no plaque.

LCX is a non-dominant artery that gives rise to small OM1 and larger
OM2. There is no plaque.

Other findings:

Normal pulmonary vein drainage into the left atrium.

Normal let atrial appendage without a thrombus.

Normal size of the pulmonary artery.
IMPRESSION: 1. Coronary calcium score of 0. This was 0 percentile for age and
sex matched control.

2. Normal coronary origin with right dominance.

3. No evidence of CAD; CAD-RADS 0.

Debb Chokshi

*** End of Addendum ***
EXAM:
OVER-READ INTERPRETATION  CT CHEST

The following report is an over-read performed by radiologist Dr.
Aude Busch [REDACTED] on 05/26/2020. This over-read
does not include interpretation of cardiac or coronary anatomy or
pathology. The coronary CTA interpretation by the cardiologist is
attached.
FINDINGS: Vascular: Normal aortic caliber. No central pulmonary embolism, on
this non-dedicated study.

Mediastinum/Nodes: No imaged thoracic adenopathy.

Lungs/Pleura: No pleural fluid.  Clear imaged lungs.

Upper Abdomen: Marked hepatic steatosis. Normal imaged portions of
the spleen, stomach.

Musculoskeletal: No acute osseous abnormality.
IMPRESSION: 1.  No acute findings in the imaged extracardiac chest.
2. Marked hepatic steatosis.

## 2021-07-31 ENCOUNTER — Encounter: Payer: Self-pay | Admitting: Cardiology

## 2021-07-31 NOTE — Telephone Encounter (Signed)
Sent this message to patient  ? ? Hello  Roger Mitchell ? ? Will send this message to Dr Jens Som?  Would like to know if you are hydrating enough prior to exercising. ?It would be helpful if  you hydrate real well the day before exercise.  Do you have  any  vital signs reading  like blood pressures  and heart rate - before and after exercises. ? ?Jasmine December RN  ?

## 2021-09-17 ENCOUNTER — Encounter: Payer: Self-pay | Admitting: Nurse Practitioner

## 2021-09-17 ENCOUNTER — Ambulatory Visit (INDEPENDENT_AMBULATORY_CARE_PROVIDER_SITE_OTHER): Payer: BC Managed Care – PPO | Admitting: Nurse Practitioner

## 2021-09-17 ENCOUNTER — Ambulatory Visit: Payer: BC Managed Care – PPO

## 2021-09-17 VITALS — BP 124/72 | HR 54 | Ht 68.0 in | Wt 191.4 lb

## 2021-09-17 DIAGNOSIS — I480 Paroxysmal atrial fibrillation: Secondary | ICD-10-CM

## 2021-09-17 DIAGNOSIS — R002 Palpitations: Secondary | ICD-10-CM

## 2021-09-17 DIAGNOSIS — K760 Fatty (change of) liver, not elsewhere classified: Secondary | ICD-10-CM

## 2021-09-17 DIAGNOSIS — R42 Dizziness and giddiness: Secondary | ICD-10-CM | POA: Diagnosis not present

## 2021-09-17 DIAGNOSIS — R030 Elevated blood-pressure reading, without diagnosis of hypertension: Secondary | ICD-10-CM

## 2021-09-17 DIAGNOSIS — R072 Precordial pain: Secondary | ICD-10-CM

## 2021-09-17 NOTE — Progress Notes (Signed)
Office Visit    Patient Name: Roger Mitchell Date of Encounter: 09/17/2021  Primary Care Provider:  Mliss Sax, MD Primary Cardiologist:  Olga Millers, MD  Chief Complaint    41 year old male with a history of paroxysmal atrial fibrillation, palpitations, atypical chest pain, elevated BP reading, hepatic steatosis, ADD, and ocular migraine who presents for follow-up related to atrial fibrillation and palpitations.  Past Medical History    Past Medical History:  Diagnosis Date   ADD (attention deficit disorder) without hyperactivity    Atrial fibrillation, persistent (HCC)    Irregular heart beat    Nicotine dependence    Ocular migraine    Pain in testicle    Sleep disorder    shift work   Testicular hypofunction    Past Surgical History:  Procedure Laterality Date   none      Allergies  No Known Allergies  History of Present Illness    41 year old male with a history of paroxysmal atrial fibrillation, palpitations, atypical chest pain, elevated BP reading, hepatic steatosis, ADD, and ocular migraine.  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 An EKG in September 2019 showed atrial fibrillation. At follow-up outpatient visit he was noted to be in sinus rhythm.  Echocardiogram in May 2021 showed normal LV function, grade 1 diastolic dysfunction. Coronary CT angiogram in January 2022 showed calcium score of 0, no evidence of CAD.  He was last seen in the office on 08/01/2020 and was stable from a cardiac standpoint.  He did note occasional brief palpitations, particularly at night, occasional nonexertional sharp pain in his chest. He presented to the ED in September 2022 with palpitations. EKG at the time showed sinus rhythm. He contacted our office on 07/31/2021 and reported increased palpitations, lightheadedness, dizziness with exercise. He was advised to monitor BP and HR in correlation with his  symptoms and to stay well-hydrated before and after exercising.  He presents today for follow-up. Since he contacted our office and over the past 1 to 2 months he has noticed lightheadedness, dizziness, palpitations with associated chest discomfort and shortness of breath that occurs only with weight bearing exercise. He does not notice the symptoms with cardio exercise, or at any other time. Other than his dizziness/lightheadedness/palpitations with weightbearing exercise, he denies any additional concerns today.  Home Medications    Current Outpatient Medications  Medication Sig Dispense Refill   Melatonin 3 MG TABS Take 2 tablets by mouth at bedtime. As needed for sleep     metoprolol succinate (TOPROL XL) 25 MG 24 hr tablet Take 1 tablet (25 mg total) by mouth daily. 90 tablet 3   naltrexone (DEPADE) 50 MG tablet Take 50 mg by mouth daily.     No current facility-administered medications for this visit.     Review of Systems    He denies chest pain, dyspnea, pnd, orthopnea, n, v, syncope, edema, weight gain, or early satiety. All other systems reviewed and are otherwise negative except as noted above.   Physical Exam    VS:  BP 124/72   Pulse (!) 54   Ht 5\' 8"  (1.727 m)   Wt 191 lb 6.4 oz (86.8 kg)   SpO2 96%   BMI 29.10 kg/m   GEN: Well nourished, well developed, in no acute distress. HEENT: normal. Neck: Supple, no JVD, carotid bruits, or masses. Cardiac: RRR, no murmurs, rubs, or gallops. No clubbing, cyanosis, edema.  Radials/DP/PT 2+  and equal bilaterally.  Respiratory:  Respirations regular and unlabored, clear to auscultation bilaterally. GI: Soft, nontender, nondistended, BS + x 4. MS: no deformity or atrophy. Skin: warm and dry, no rash. Neuro:  Strength and sensation are intact. Psych: Normal affect.  Accessory Clinical Findings    ECG personally reviewed by me today -sinus bradycardia, 54 bpm, sinus arrhythmia- no acute changes.  Lab Results  Component Value  Date   WBC 5.4 02/23/2021   HGB 16.4 02/23/2021   HCT 48.9 02/23/2021   MCV 93.1 02/23/2021   PLT 226.0 02/23/2021   Lab Results  Component Value Date   CREATININE 1.31 02/23/2021   BUN 12 02/23/2021   NA 139 02/23/2021   K 4.1 02/23/2021   CL 102 02/23/2021   CO2 28 02/23/2021   Lab Results  Component Value Date   ALT 29 05/25/2021   AST 19 05/25/2021   ALKPHOS 66 05/25/2021   BILITOT 0.7 05/25/2021   Lab Results  Component Value Date   CHOL 204 (H) 02/23/2021   HDL 60.50 02/23/2021   LDLCALC 90 08/04/2006   LDLDIRECT 126.0 02/23/2021   TRIG 219.0 (H) 02/23/2021   CHOLHDL 3 02/23/2021    No results found for: HGBA1C  Assessment & Plan    1. Dizziness/lightheadedness/palpitations: Over the past 1 to 2 months he has noticed lightheadedness, dizziness, palpitations with associated chest discomfort, shortness of breath with weight bearing exercise. Echo in May 2021 showed normal LV function, grade 1 diastolic dysfunction. He does not notice the symptoms with cardio exercise.  His symptoms are consistent with possible vagal response.  However, will repeat echocardiogram to rule out structural cause for symptoms, will check 7-day Zio monitor, BMET, TSH.  Recommended adequate hydration, discussed possible use of abdominal binder, avoiding overly strenuous weightbearing activities.  2. Paroxysmal atrial fibrillation: Diagnosed in 2019.  Most recent echo as above.  He notes 2-3 episodes of what he felt was breakthrough atrial fibrillation, denies any real associated symptoms. EKG today shows sinus bradycardia with sinus arrhythmia, 54 bpm. Repeat echo, cardiac monitor pending as above.  Discussed self monitoring with [employer] device. CHA2DS2VASc = 0. No indication for anticoagulation at this time. Continue metoprolol.  3. Atypical chest pain:  Coronary CT angiogram in January 2022 showed calcium score of 0, no evidence of CAD. Stable with no anginal symptoms. No indication for  ischemic evaluation.   4. Elevated BP readings: He has a history of elevated BP readings in the past, however, BP well controlled today.  5. Hepatic steatosis: Noted on prior CT.  Labs normalized with reduced alcohol intake.  6. Disposition: Follow-up in 6-8 weeks.   Joylene Grapes, NP 09/17/2021, 12:55 PM

## 2021-09-17 NOTE — Patient Instructions (Addendum)
Medication Instructions:  Your physician recommends that you continue on your current medications as directed. Please refer to the Current Medication list given to you today.  *If you need a refill on your cardiac medications before your next appointment, please call your pharmacy*   Lab Work: Your physician recommends that you complete labs today BMET TSH If you have labs (blood work) drawn today and your tests are completely normal, you will receive your results only by: MyChart Message (if you have MyChart) OR A paper copy in the mail If you have any lab test that is abnormal or we need to change your treatment, we will call you to review the results.   Testing/Procedures: Your physician has requested that you have an echocardiogram. Echocardiography is a painless test that uses sound waves to create images of your heart. It provides your doctor with information about the size and shape of your heart and how well your heart's chambers and valves are working. This procedure takes approximately one hour. There are no restrictions for this procedure.   ZIO XT- Long Term Monitor Instructions  Your physician has requested you wear a ZIO patch monitor for 7 days.  This is a single patch monitor. [employer] supplies one patch monitor per enrollment. Additional stickers are not available. Please do not apply patch if you will be having a Nuclear Stress Test,  Echocardiogram, Cardiac CT, MRI, or Chest Xray during the period you would be wearing the  monitor. The patch cannot be worn during these tests. You cannot remove and re-apply the  ZIO XT patch monitor.  Your ZIO patch monitor will be mailed 3 day USPS to your address on file. It may take 3-5 days  to receive your monitor after you have been enrolled.  Once you have received your monitor, please review the enclosed instructions. Your monitor  has already been registered assigning a specific monitor serial # to you.  Billing and Patient  Assistance Program Information  We have supplied [employer] with any of your insurance information on file for billing purposes. [employer] offers a sliding scale Patient Assistance Program for patients that do not have  insurance, or whose insurance does not completely cover the cost of the ZIO monitor.  You must apply for the Patient Assistance Program to qualify for this discounted rate.  To apply, please call [employer] at [phone], select option 4, select option 2, ask to apply for  Patient Assistance Program. Meredeth Ide will ask your household income, and how many people  are in your household. They will quote your out-of-pocket cost based on that information.  [employer] will also be able to set up a 35-month, interest-free payment plan if needed.  Applying the monitor   Shave hair from upper left chest.  Hold abrader disc by orange tab. Rub abrader in 40 strokes over the upper left chest as  indicated in your monitor instructions.  Clean area with 4 enclosed alcohol pads. Let dry.  Apply patch as indicated in monitor instructions. Patch will be placed under collarbone on left  side of chest with arrow pointing upward.  Rub patch adhesive wings for 2 minutes. Remove white label marked "1". Remove the white  label marked "2". Rub patch adhesive wings for 2 additional minutes.  While looking in a mirror, press and release button in center of patch. A small green light will  flash 3-4 times. This will be your only indicator that the monitor has been turned on.  Do not shower for  the first 24 hours. You may shower after the first 24 hours.  Press the button if you feel a symptom. You will hear a small click. Record Date, Time and  Symptom in the Patient Logbook.  When you are ready to remove the patch, follow instructions on the last 2 pages of Patient  Logbook. Stick patch monitor onto the last page of Patient Logbook.  Place Patient Logbook in the blue and white box. Use locking tab on box and  tape box closed  securely. The blue and white box has prepaid postage on it. Please place it in the mailbox as  soon as possible. Your physician should have your test results approximately 7 days after the  monitor has been mailed back to [employer].  Call [facility] Customer Care at [phone] if you have questions regarding  your ZIO XT patch monitor. Call them immediately if you see an orange light blinking on your  monitor.  If your monitor falls off in less than 4 days, contact our Monitor department at [phone].  If your monitor becomes loose or falls off after 4 days call [employer] at [phone] for  suggestions on securing your monitor    Follow-Up: At [facility], you and your health needs are our priority.  As part of our continuing mission to provide you with exceptional heart care, we have created designated Provider Care Teams.  These Care Teams include your primary Cardiologist (physician) and Advanced Practice Providers (APPs -  Physician Assistants and Nurse Practitioners) who all work together to provide you with the care you need, when you need it.  We recommend signing up for the patient portal called "MyChart".  Sign up information is provided on this After Visit Summary.  MyChart is used to connect with patients for Virtual Visits (Telemedicine).  Patients are able to view lab/test results, encounter notes, upcoming appointments, etc.  Non-urgent messages can be sent to your provider as well.   To learn more about what you can do with MyChart, go to [email or web address].    Your next appointment:   6-8 week(s)  The format for your next appointment:   In Person  Provider:   Bernadene Person, NP        Other Instructions   Important Information About Sugar

## 2021-09-17 NOTE — Progress Notes (Unsigned)
Enrolled for [employer] to mail a ZIO XT long term holter monitor to the patients address on file.   Dr. Crenshaw to read. 

## 2021-09-18 ENCOUNTER — Telehealth: Payer: Self-pay

## 2021-09-18 LAB — BASIC METABOLIC PANEL
BUN/Creatinine Ratio: 14 (ref 9–20)
BUN: 17 mg/dL (ref 6–24)
CO2: 21 mmol/L (ref 20–29)
Calcium: 9.6 mg/dL (ref 8.7–10.2)
Chloride: 102 mmol/L (ref 96–106)
Creatinine, Ser: 1.19 mg/dL (ref 0.76–1.27)
Glucose: 73 mg/dL (ref 70–99)
Potassium: 4.4 mmol/L (ref 3.5–5.2)
Sodium: 139 mmol/L (ref 134–144)
eGFR: 79 mL/min/{1.73_m2} (ref >59)

## 2021-09-18 LAB — TSH: TSH: 1.19 u[IU]/mL (ref 0.450–4.500)

## 2021-09-18 NOTE — Telephone Encounter (Signed)
Spoke with pt. Pt was notified of lab results. Pt will continue his current medications as discussed and proceed with monitor/echo.

## 2021-10-06 ENCOUNTER — Ambulatory Visit (HOSPITAL_COMMUNITY): Payer: BC Managed Care – PPO

## 2021-10-13 ENCOUNTER — Ambulatory Visit (HOSPITAL_COMMUNITY)
Admission: RE | Admit: 2021-10-13 | Discharge: 2021-10-13 | Disposition: A | Discharge status: Home / Self Care | Payer: BC Managed Care – PPO | Source: Ambulatory Visit | Attending: Nurse Practitioner | Admitting: Nurse Practitioner

## 2021-10-13 DIAGNOSIS — I2510 Atherosclerotic heart disease of native coronary artery without angina pectoris: Secondary | ICD-10-CM | POA: Insufficient documentation

## 2021-10-13 DIAGNOSIS — R002 Palpitations: Secondary | ICD-10-CM | POA: Diagnosis not present

## 2021-10-13 DIAGNOSIS — K760 Fatty (change of) liver, not elsewhere classified: Secondary | ICD-10-CM

## 2021-10-13 DIAGNOSIS — R072 Precordial pain: Secondary | ICD-10-CM | POA: Diagnosis not present

## 2021-10-13 DIAGNOSIS — I480 Paroxysmal atrial fibrillation: Secondary | ICD-10-CM

## 2021-10-13 DIAGNOSIS — F17210 Nicotine dependence, cigarettes, uncomplicated: Secondary | ICD-10-CM | POA: Insufficient documentation

## 2021-10-13 DIAGNOSIS — I10 Essential (primary) hypertension: Secondary | ICD-10-CM | POA: Insufficient documentation

## 2021-10-13 DIAGNOSIS — R42 Dizziness and giddiness: Secondary | ICD-10-CM

## 2021-10-13 DIAGNOSIS — R030 Elevated blood-pressure reading, without diagnosis of hypertension: Secondary | ICD-10-CM

## 2021-10-13 LAB — ECHOCARDIOGRAM COMPLETE
Area-P 1/2: 3.53 cm2
S' Lateral: 2.8 cm

## 2021-10-20 ENCOUNTER — Telehealth: Payer: Self-pay

## 2021-11-05 ENCOUNTER — Ambulatory Visit: Payer: BC Managed Care – PPO | Admitting: Nurse Practitioner

## 2021-12-04 ENCOUNTER — Ambulatory Visit: Payer: BC Managed Care – PPO | Admitting: Nurse Practitioner

## 2021-12-04 NOTE — Progress Notes (Deleted)
Office Visit    Patient Name: Roger Mitchell Date of Encounter: 12/04/2021  Primary Care Provider:  Mliss Sax, MD Primary Cardiologist:  Olga Millers, MD  Chief Complaint    41 year old male with a history of paroxysmal atrial fibrillation, palpitations, atypical chest pain, elevated BP reading, hepatic steatosis, ADD, and ocular migraine who presents for follow-up related to atrial fibrillation and palpitations.  Past Medical History    Past Medical History:  Diagnosis Date   ADD (attention deficit disorder) without hyperactivity    Atrial fibrillation, persistent (HCC)    Irregular heart beat    Nicotine dependence    Ocular migraine    Pain in testicle    Sleep disorder    shift work   Testicular hypofunction    Past Surgical History:  Procedure Laterality Date   none      Allergies  No Known Allergies  History of Present Illness    41 year old male with a history of paroxysmal atrial fibrillation, palpitations, atypical chest pain, elevated BP reading, hepatic steatosis, ADD, and ocular migraine.   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 An EKG in September 2019 showed atrial fibrillation. At follow-up outpatient visit he was noted to be in sinus rhythm.  Echocardiogram in May 2021 showed normal LV function, grade 1 diastolic dysfunction. Coronary CT angiogram in January 2022 showed calcium score of 0, no evidence of CAD.  He presented to the ED in September 2022 with palpitations. EKG at the time showed sinus rhythm.  Last seen in the office on 09/17/2021 and reported a 1 to 2 months history of lightheadedness, dizziness, palpitations with associated chest discomfort and shortness of breath with weight bearing exercise.  7-day  ZIO monitor was ordered for the patient.  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   He presents today for follow-up.  Since his last visit  1. Dizziness/lightheadedness/palpitations: Lightheadedness, dizziness, palpitations with associated chest discomfort, shortness of breath with weight bearing exercise.  Repeat echo overall normal, did show COVID test mitral valve with trivial MR, mild holosystolic prolapse of anterior mitral valve.  7-day ZIO monitor was ordered and is pending.  Labs including PMH, TSH were unremarkable.  Symptoms are consistent with possible vagal response.  Recommend adequate hydration, discussed possible use of abdominal binder, avoiding overly strenuous weightbearing activities.   2. Paroxysmal atrial fibrillation: Diagnosed in 2019.  Most recent echo as above.  He notes 2-3 episodes of what he felt was breakthrough atrial fibrillation, denies any real associated symptoms.  Discussed self monitoring with [employer] device. CHA2DS2VASc = 0. No indication for anticoagulation at this time. Continue metoprolol.  3. Atypical chest pain:  Coronary CT angiogram in January 2022 showed calcium score of 0, no evidence of CAD. Stable with no anginal symptoms. No indication for ischemic evaluation.   4.  Mitral valve prolapse/mitral valve regurgitation:  5. Elevated BP readings: He has a history of elevated BP readings in the past, however, BP well controlled today.  6. Hepatic steatosis: Noted on prior CT.  Labs normalized with reduced alcohol intake.  7. Disposition: Follow-up in   Home Medications    Current Outpatient Medications  Medication Sig Dispense Refill   Melatonin 3 MG TABS Take 2 tablets by mouth at bedtime. As needed for sleep  metoprolol succinate (TOPROL XL) 25 MG 24 hr tablet Take 1 tablet (25 mg total) by mouth daily. 90 tablet 3   naltrexone (DEPADE) 50 MG tablet Take 50 mg by mouth daily.     No current facility-administered medications  for this visit.     Review of Systems    ***.  All other systems reviewed and are otherwise negative except as noted above.    Physical Exam    VS:  There were no vitals taken for this visit. , BMI There is no height or weight on file to calculate BMI.     GEN: Well nourished, well developed, in no acute distress. HEENT: normal. Neck: Supple, no JVD, carotid bruits, or masses. Cardiac: RRR, no murmurs, rubs, or gallops. No clubbing, cyanosis, edema.  Radials/DP/PT 2+ and equal bilaterally.  Respiratory:  Respirations regular and unlabored, clear to auscultation bilaterally. GI: Soft, nontender, nondistended, BS + x 4. MS: no deformity or atrophy. Skin: warm and dry, no rash. Neuro:  Strength and sensation are intact. Psych: Normal affect.  Accessory Clinical Findings    ECG personally reviewed by me today - *** - no acute changes.  Lab Results  Component Value Date   WBC 5.4 02/23/2021   HGB 16.4 02/23/2021   HCT 48.9 02/23/2021   MCV 93.1 02/23/2021   PLT 226.0 02/23/2021   Lab Results  Component Value Date   CREATININE 1.19 09/17/2021   BUN 17 09/17/2021   NA 139 09/17/2021   K 4.4 09/17/2021   CL 102 09/17/2021   CO2 21 09/17/2021   Lab Results  Component Value Date   ALT 29 05/25/2021   AST 19 05/25/2021   ALKPHOS 66 05/25/2021   BILITOT 0.7 05/25/2021   Lab Results  Component Value Date   CHOL 204 (H) 02/23/2021   HDL 60.50 02/23/2021   LDLCALC 90 08/04/2006   LDLDIRECT 126.0 02/23/2021   TRIG 219.0 (H) 02/23/2021   CHOLHDL 3 02/23/2021    No results found for: "HGBA1C"  Assessment & Plan    1.  ***   Joylene Grapes, NP 12/04/2021, 4:38 AM

## 2022-02-07 ENCOUNTER — Other Ambulatory Visit: Payer: Self-pay

## 2022-02-07 ENCOUNTER — Emergency Department (HOSPITAL_BASED_OUTPATIENT_CLINIC_OR_DEPARTMENT_OTHER)
Admission: EM | Admit: 2022-02-07 | Discharge: 2022-02-07 | Disposition: A | Discharge status: Home / Self Care | Payer: BC Managed Care – PPO | Attending: Emergency Medicine | Admitting: Emergency Medicine

## 2022-02-07 ENCOUNTER — Ambulatory Visit (HOSPITAL_BASED_OUTPATIENT_CLINIC_OR_DEPARTMENT_OTHER): Admit: 2022-02-07 | Payer: BC Managed Care – PPO

## 2022-02-07 DIAGNOSIS — M79604 Pain in right leg: Secondary | ICD-10-CM | POA: Diagnosis not present

## 2022-02-07 DIAGNOSIS — I4819 Other persistent atrial fibrillation: Secondary | ICD-10-CM | POA: Diagnosis not present

## 2022-02-07 MED ORDER — ENOXAPARIN SODIUM 100 MG/ML IJ SOSY
1.0000 mg/kg | PREFILLED_SYRINGE | Freq: Once | INTRAMUSCULAR | Status: AC
  Administered 2022-02-07: 87.5 mg via SUBCUTANEOUS
  Filled 2022-02-07: qty 1

## 2022-02-07 NOTE — ED Provider Notes (Signed)
Emergency Department Provider Note   I have reviewed the triage vital signs and the nursing notes.   HISTORY  Chief Complaint Leg Pain (Right calf pain beginning today after getting off of 13hr flight home from [place]. No swelling/tightness/redness noted on assessment. )   HPI Roger Mitchell is a 41 y.o. male with PMH reviewed below, not anticoagulated, presents to the ED for evaluation of right calf pain.  Patient just returned from [place] with a Roger Mitchell flight getting back this morning.  No some discomfort in the right calf without injury.  No swelling.  Pain radiates from the calf to just above the knee.  Denies any chest pain or trouble breathing. No prior history of DVT/PE.   Past Medical History:  Diagnosis Date   ADD (attention deficit disorder) without hyperactivity    Atrial fibrillation, persistent (HCC)    Irregular heart beat    Nicotine dependence    Ocular migraine    Pain in testicle    Sleep disorder    shift work   Testicular hypofunction     Review of Systems  Constitutional: No fever/chills Cardiovascular: Denies chest pain. Respiratory: Denies shortness of breath. Gastrointestinal: No abdominal pain.  Musculoskeletal: Positive right calf pain.   ____________________________________________   PHYSICAL EXAM:  VITAL SIGNS: ED Triage Vitals  Enc Vitals Group     BP 02/07/22 0158 (!) 141/97     Pulse Rate 02/07/22 0158 71     Resp 02/07/22 0158 14     Temp 02/07/22 0158 98.2 F (36.8 C)     Temp Source 02/07/22 0158 Oral     SpO2 02/07/22 0158 98 %     Weight 02/07/22 0200 192 lb (87.1 kg)     Height 02/07/22 0200 5\' 8"  (1.727 m)   Constitutional: Alert and oriented. Well appearing and in no acute distress. Eyes: Conjunctivae are normal. Head: Atraumatic. Nose: No congestion/rhinnorhea. Mouth/Throat: Mucous membranes are moist.  Neck: No stridor.  Cardiovascular: Normal rate, regular rhythm.  Respiratory: Normal respiratory effort.    Gastrointestinal: No distention.  Musculoskeletal: No lower extremity tenderness nor edema. No gross deformities of extremities. Neurologic:  Normal speech and language. No gross focal neurologic deficits are appreciated.  Skin:  Skin is warm, dry and intact. No rash noted.  ____________________________________________   PROCEDURES  Procedure(s) performed:   Procedures  None ____________________________________________   INITIAL IMPRESSION / ASSESSMENT AND PLAN / ED COURSE  Pertinent labs & imaging results that were available during my care of the patient were reviewed by me and considered in my medical decision making (see chart for details).   This patient is Presenting for Evaluation of leg pain, which does require a range of treatment options, and is a complaint that involves a moderate risk of morbidity and mortality.  The Differential Diagnoses include DVT, msk pain, contusion, etc.  Critical Interventions-    Medications  enoxaparin (LOVENOX) injection 87.5 mg (has no administration in time range)    Medical Decision Making: Summary:  Patient presents to the ED with calf and leg pain after a Roger Mitchell flight getting back earlier this morning from .  No appreciable leg edema, erythema, warmth.  Full range of motion and patient is ambulatory.  Normal vital signs with exception of mild elevated blood pressure.  No subjective chest pain, shortness of breath, heart palpitations.  Ultrasound not available at this hour.  Given history, discussed giving a dose of Lovenox here for overnight coverage and will have him return  at 10 AM for an outpatient ultrasound, which was scheduled here.   Disposition: discharge  ____________________________________________  FINAL CLINICAL IMPRESSION(S) / ED DIAGNOSES  Final diagnoses:  Right leg pain    Note:  This document was prepared using Dragon voice recognition software and may include unintentional dictation errors.  Nanda Quinton, MD, [facility]    Bridgett Hattabaugh, [facility], MD 02/07/22 [phone]

## 2022-02-07 NOTE — ED Triage Notes (Signed)
Right calf pain beginning today after getting off of 13hr flight home from [place]. No swelling/tightness/redness noted on assessment.

## 2022-02-07 NOTE — Discharge Instructions (Addendum)
You were seen in the emergency room today with right leg pain.  I have scheduled for you to have an ultrasound later this morning at 10 AM.  Please arrive 15 minutes prior to the appointment and check in through the emergency department telling them you are here for an outpatient ultrasound of your leg.  If you need to change the appointment time you may call [phone].   If you develop sudden chest pain or trouble breathing you should return to the emergency department immediately for reevaluation.

## 2022-02-10 ENCOUNTER — Telehealth (HOSPITAL_BASED_OUTPATIENT_CLINIC_OR_DEPARTMENT_OTHER): Payer: Self-pay

## 2022-02-11 ENCOUNTER — Ambulatory Visit (HOSPITAL_BASED_OUTPATIENT_CLINIC_OR_DEPARTMENT_OTHER)
Admission: RE | Admit: 2022-02-11 | Discharge: 2022-02-11 | Disposition: A | Discharge status: Home / Self Care | Payer: BC Managed Care – PPO | Source: Ambulatory Visit | Attending: Emergency Medicine | Admitting: Emergency Medicine

## 2022-02-11 DIAGNOSIS — M79661 Pain in right lower leg: Secondary | ICD-10-CM | POA: Diagnosis not present

## 2022-02-11 DIAGNOSIS — M79604 Pain in right leg: Secondary | ICD-10-CM | POA: Diagnosis not present

## 2022-03-30 ENCOUNTER — Encounter: Payer: Self-pay | Admitting: Family Medicine

## 2022-03-30 ENCOUNTER — Ambulatory Visit (INDEPENDENT_AMBULATORY_CARE_PROVIDER_SITE_OTHER): Payer: BC Managed Care – PPO | Admitting: Family Medicine

## 2022-03-30 VITALS — BP 118/70 | HR 75 | Temp 98.4°F | Wt 195.6 lb

## 2022-03-30 DIAGNOSIS — Z0000 Encounter for general adult medical examination without abnormal findings: Secondary | ICD-10-CM

## 2022-03-30 DIAGNOSIS — R5383 Other fatigue: Secondary | ICD-10-CM | POA: Diagnosis not present

## 2022-03-30 DIAGNOSIS — R6882 Decreased libido: Secondary | ICD-10-CM | POA: Diagnosis not present

## 2022-03-30 DIAGNOSIS — B001 Herpesviral vesicular dermatitis: Secondary | ICD-10-CM | POA: Diagnosis not present

## 2022-03-30 MED ORDER — VALACYCLOVIR HCL 1 G PO TABS: 500.0000 mg | ORAL_TABLET | Freq: Two times a day (BID) | ORAL | 2 refills | Status: AC

## 2022-03-30 NOTE — Progress Notes (Signed)
Established Patient Office Visit   Subjective:  Patient ID: Roger Mitchell, male    DOB: [birth year]  Age: 41 y.o. MRN: [id number]  Chief Complaint  Patient presents with   Follow-up    Follow up, patient would like labs little low energy.     HPI Encounter Diagnoses  Name Primary?   Other fatigue Yes   Libido, decreased    Herpes labialis    Healthcare maintenance    For follow-up of fatigue, decreased libido and history of fever blisters.  Fever blisters rarely bother to bother him.  He is having an outbreak now.  It is crusted over.  He reports fatigue and would like to have vitamin A, B, C, D and E levels checked.  Testosterone levels were okay last year.  He requests to have them rechecked.  Currently taking naltrexone for alcohol abuse.  Believes that it is helping.   Review of Systems  Constitutional:  Positive for malaise/fatigue. Negative for weight loss.  HENT: Negative.    Eyes:  Negative for blurred vision, discharge and redness.  Respiratory: Negative.    Cardiovascular: Negative.   Gastrointestinal:  Negative for abdominal pain.  Genitourinary: Negative.   Musculoskeletal: Negative.  Negative for myalgias.  Skin:  Negative for rash.  Neurological:  Negative for tingling, loss of consciousness and weakness.  Endo/Heme/Allergies:  Negative for polydipsia.      03/30/2022    2:01 PM 05/25/2021    4:21 PM 05/25/2021    2:36 PM  Depression screen PHQ 2/9  Decreased Interest 0 1 0  Down, Depressed, Hopeless 0 0 0  PHQ - 2 Score 0 1 0  Altered sleeping  2   Tired, decreased energy  1   Change in appetite  0   Feeling bad or failure about yourself   0   Trouble concentrating  0   Moving slowly or fidgety/restless  0   Suicidal thoughts  0   PHQ-9 Score  4   Difficult doing work/chores  Somewhat difficult        Current Outpatient Medications:    Melatonin 3 MG TABS, Take 2 tablets by mouth at bedtime. As needed for sleep, Disp: , Rfl:    naltrexone  (DEPADE) 50 MG tablet, Take 50 mg by mouth daily., Disp: , Rfl:    valACYclovir (VALTREX) 1000 MG tablet, Take 0.5 tablets (500 mg total) by mouth 2 (two) times daily for 3 days. As needed., Disp: 6 tablet, Rfl: 2   metoprolol succinate (TOPROL XL) 25 MG 24 hr tablet, Take 1 tablet (25 mg total) by mouth daily. (Patient not taking: Reported on 03/30/2022), Disp: 90 tablet, Rfl: 3   Objective:     BP 118/70 (BP Location: Right Arm, Patient Position: Sitting, Cuff Size: Normal)   Pulse 75   Temp 98.4 F (36.9 C) (Temporal)   Wt 195 lb 9.6 oz (88.7 kg)   SpO2 96%   BMI 29.74 kg/m  Wt Readings from Last 3 Encounters:  03/30/22 195 lb 9.6 oz (88.7 kg)  02/07/22 192 lb (87.1 kg)  09/17/21 191 lb 6.4 oz (86.8 kg)      Physical Exam Constitutional:      General: He is not in acute distress.    Appearance: Normal appearance. He is not ill-appearing, toxic-appearing or diaphoretic.  HENT:     Head: Normocephalic and atraumatic.     Right Ear: External ear normal.     Left Ear: External ear normal.  Mouth/Throat:     Mouth: Mucous membranes are moist.     Pharynx: Oropharynx is clear. No oropharyngeal exudate or posterior oropharyngeal erythema.  Eyes:     General: No scleral icterus.       Right eye: No discharge.        Left eye: No discharge.     Extraocular Movements: Extraocular movements intact.     Conjunctiva/sclera: Conjunctivae normal.  Cardiovascular:     Rate and Rhythm: Normal rate and regular rhythm.  Pulmonary:     Effort: Pulmonary effort is normal. No respiratory distress.     Breath sounds: Normal breath sounds.  Skin:    General: Skin is warm and dry.  Neurological:     Mental Status: He is alert and oriented to person, place, and time.  Psychiatric:        Mood and Affect: Mood normal.        Behavior: Behavior normal.      No results found for any visits on 03/30/22.    The 10-year ASCVD risk score (Arnett DK, et al., 2019) is: 4.6%     Assessment & Plan:   Other fatigue -     VITAMIN D 25 Hydroxy (Vit-D Deficiency, Fractures) -     B12 and Folate Panel -     CBC -     Comprehensive metabolic panel -     Hemoglobin A1c -     Vitamin A -     Vitamin C -     Vitamin E -     C-reactive protein -     Sedimentation rate  Libido, decreased -     Testosterone  Herpes labialis -     valACYclovir HCl; Take 0.5 tablets (500 mg total) by mouth 2 (two) times daily for 3 days. As needed.  Dispense: 6 tablet; Refill: 2  Healthcare maintenance -     Ambulatory referral to Ophthalmology    Return in about 8 weeks (around 05/25/2022).  Encouraged him to become sober with or without naltrexone.  Encouraged him to go to [employer]  He understands that above requested vitamin levels are not part of the usual work-up for fatigue.  He was asked to sign a waiver encouraged him to exercise.  Mliss Sax, MD

## 2022-03-31 LAB — VITAMIN D 25 HYDROXY (VIT D DEFICIENCY, FRACTURES): VITD: 27.6 ng/mL — ABNORMAL LOW (ref 30.00–100.00)

## 2022-03-31 LAB — COMPREHENSIVE METABOLIC PANEL
ALT: 31 U/L (ref 0–53)
AST: 16 U/L (ref 0–37)
Albumin: 4.7 g/dL (ref 3.5–5.2)
Alkaline Phosphatase: 93 U/L (ref 39–117)
BUN: 13 mg/dL (ref 6–23)
CO2: 29 mEq/L (ref 19–32)
Calcium: 9.3 mg/dL (ref 8.4–10.5)
Chloride: 104 mEq/L (ref 96–112)
Creatinine, Ser: 1.11 mg/dL (ref 0.40–1.50)
GFR: 82.77 mL/min (ref >60.00)
Glucose, Bld: 91 mg/dL (ref 70–99)
Potassium: 4.2 mEq/L (ref 3.5–5.1)
Sodium: 139 mEq/L (ref 135–145)
Total Bilirubin: 0.5 mg/dL (ref 0.2–1.2)
Total Protein: 7 g/dL (ref 6.0–8.3)

## 2022-03-31 LAB — SEDIMENTATION RATE: Sed Rate: 5 mm/hr (ref 0–15)

## 2022-03-31 LAB — TESTOSTERONE: Testosterone: 277.52 ng/dL — ABNORMAL LOW (ref 300.00–890.00)

## 2022-03-31 LAB — CBC
HCT: 44.9 % (ref 39.0–52.0)
Hemoglobin: 15.8 g/dL (ref 13.0–17.0)
MCHC: 35.1 g/dL (ref 30.0–36.0)
MCV: 89.1 fl (ref 78.0–100.0)
Platelets: 243 10*3/uL (ref 150.0–400.0)
RBC: 5.04 Mil/uL (ref 4.22–5.81)
RDW: 12.6 % (ref 11.5–15.5)
WBC: 5.2 10*3/uL (ref 4.0–10.5)

## 2022-03-31 LAB — B12 AND FOLATE PANEL
Folate: 21.5 ng/mL (ref >5.9)
Vitamin B-12: 398 pg/mL (ref 211–911)

## 2022-03-31 LAB — HEMOGLOBIN A1C: Hgb A1c MFr Bld: 5.6 % (ref 4.6–6.5)

## 2022-03-31 LAB — C-REACTIVE PROTEIN: CRP: <1 mg/dL (ref 0.5–20.0)

## 2022-04-05 LAB — VITAMIN C: Vitamin C: 0.6 mg/dL (ref 0.2–2.1)

## 2022-04-05 LAB — VITAMIN E
Gamma-Tocopherol (Vit E): <1 mg/L (ref <=4.3)
Vitamin E (Alpha Tocopherol): 15.3 mg/L (ref 5.7–19.9)

## 2022-04-05 LAB — VITAMIN A: Vitamin A (Retinoic Acid): 65 ug/dL (ref 38–98)

## 2022-04-06 ENCOUNTER — Encounter: Payer: Self-pay | Admitting: Family Medicine

## 2022-04-07 ENCOUNTER — Other Ambulatory Visit: Payer: Self-pay

## 2022-04-07 DIAGNOSIS — E291 Testicular hypofunction: Secondary | ICD-10-CM

## 2022-04-14 ENCOUNTER — Emergency Department (HOSPITAL_BASED_OUTPATIENT_CLINIC_OR_DEPARTMENT_OTHER)
Admission: EM | Admit: 2022-04-14 | Discharge: 2022-04-14 | Disposition: A | Discharge status: Home / Self Care | Payer: BC Managed Care – PPO | Attending: Emergency Medicine | Admitting: Emergency Medicine

## 2022-04-14 ENCOUNTER — Encounter (HOSPITAL_BASED_OUTPATIENT_CLINIC_OR_DEPARTMENT_OTHER): Payer: Self-pay

## 2022-04-14 ENCOUNTER — Other Ambulatory Visit: Payer: Self-pay

## 2022-04-14 DIAGNOSIS — R1013 Epigastric pain: Secondary | ICD-10-CM | POA: Insufficient documentation

## 2022-04-14 DIAGNOSIS — R1010 Upper abdominal pain, unspecified: Secondary | ICD-10-CM | POA: Diagnosis not present

## 2022-04-14 LAB — CBC WITH DIFFERENTIAL/PLATELET
Abs Immature Granulocytes: 0.02 10*3/uL (ref 0.00–0.07)
Basophils Absolute: 0 10*3/uL (ref 0.0–0.1)
Basophils Relative: 1 %
Eosinophils Absolute: 0.1 10*3/uL (ref 0.0–0.5)
Eosinophils Relative: 2 %
HCT: 43 % (ref 39.0–52.0)
Hemoglobin: 14.9 g/dL (ref 13.0–17.0)
Immature Granulocytes: 0 %
Lymphocytes Relative: 18 %
Lymphs Abs: 1.5 10*3/uL (ref 0.7–4.0)
MCH: 30.3 pg (ref 26.0–34.0)
MCHC: 34.7 g/dL (ref 30.0–36.0)
MCV: 87.4 fL (ref 80.0–100.0)
Monocytes Absolute: 0.4 10*3/uL (ref 0.1–1.0)
Monocytes Relative: 5 %
Neutro Abs: 6.2 10*3/uL (ref 1.7–7.7)
Neutrophils Relative %: 74 %
Platelets: 229 10*3/uL (ref 150–400)
RBC: 4.92 MIL/uL (ref 4.22–5.81)
RDW: 11.5 % (ref 11.5–15.5)
WBC: 8.3 10*3/uL (ref 4.0–10.5)
nRBC: 0 % (ref 0.0–0.2)

## 2022-04-14 LAB — URINALYSIS, ROUTINE W REFLEX MICROSCOPIC
Bilirubin Urine: NEGATIVE
Glucose, UA: NEGATIVE mg/dL
Hgb urine dipstick: NEGATIVE
Ketones, ur: NEGATIVE mg/dL
Leukocytes,Ua: NEGATIVE
Nitrite: NEGATIVE
Protein, ur: NEGATIVE mg/dL
Specific Gravity, Urine: 1.015 (ref 1.005–1.030)
pH: 7 (ref 5.0–8.0)

## 2022-04-14 LAB — COMPREHENSIVE METABOLIC PANEL
ALT: 41 U/L (ref 0–44)
AST: 23 U/L (ref 15–41)
Albumin: 4 g/dL (ref 3.5–5.0)
Alkaline Phosphatase: 63 U/L (ref 38–126)
Anion gap: 7 (ref 5–15)
BUN: 12 mg/dL (ref 6–20)
CO2: 25 mmol/L (ref 22–32)
Calcium: 9.3 mg/dL (ref 8.9–10.3)
Chloride: 106 mmol/L (ref 98–111)
Creatinine, Ser: 0.97 mg/dL (ref 0.61–1.24)
GFR, Estimated: >60 mL/min (ref >60)
Glucose, Bld: 111 mg/dL — ABNORMAL HIGH (ref 70–99)
Potassium: 4.5 mmol/L (ref 3.5–5.1)
Sodium: 138 mmol/L (ref 135–145)
Total Bilirubin: 0.7 mg/dL (ref 0.3–1.2)
Total Protein: 7.3 g/dL (ref 6.5–8.1)

## 2022-04-14 LAB — LIPASE, BLOOD: Lipase: 41 U/L (ref 11–51)

## 2022-04-14 MED ORDER — ALUM & MAG HYDROXIDE-SIMETH 200-200-20 MG/5ML PO SUSP
30.0000 mL | Freq: Once | ORAL | Status: AC
  Administered 2022-04-14: 30 mL via ORAL
  Filled 2022-04-14: qty 30

## 2022-04-14 MED ORDER — DICYCLOMINE HCL 20 MG PO TABS: 20.0000 mg | ORAL_TABLET | Freq: Two times a day (BID) | ORAL | 0 refills | Status: DC

## 2022-04-14 NOTE — Discharge Instructions (Signed)
Please read and follow all provided instructions.  Your diagnoses today include:  1. Epigastric abdominal pain     Tests performed today include: Blood cell counts and platelets Kidney and liver function tests Pancreas function test (called lipase) Urine test to look for infection Vital signs. See below for your results today.   Medications prescribed:  Bentyl - medication for intestinal cramps and spasms  Take any prescribed medications only as directed.  Home care instructions:  Follow any educational materials contained in this packet.  Follow-up instructions: Please follow-up with your primary care provider in the next 3 days for further evaluation of your symptoms.    Return instructions:  SEEK IMMEDIATE MEDICAL ATTENTION IF: The pain does not go away or becomes severe  A temperature above 101F develops  Repeated vomiting occurs (multiple episodes)  The pain becomes localized to portions of the abdomen. The right side could possibly be appendicitis. In an adult, the left lower portion of the abdomen could be colitis or diverticulitis.  Blood is being passed in stools or vomit (bright red or black tarry stools)  You develop chest pain, difficulty breathing, dizziness or fainting, or become confused, poorly responsive, or inconsolable (young children) If you have any other emergent concerns regarding your health  Additional Information: Abdominal (belly) pain can be caused by many things. Your caregiver performed an examination and possibly ordered blood/urine tests and imaging (CT scan, x-rays, ultrasound). Many cases can be observed and treated at home after initial evaluation in the emergency department. Even though you are being discharged home, abdominal pain can be unpredictable. Therefore, you need a repeated exam if your pain does not resolve, returns, or worsens. Most patients with abdominal pain don't have to be admitted to the hospital or have surgery, but serious  problems like appendicitis and gallbladder attacks can start out as nonspecific pain. Many abdominal conditions cannot be diagnosed in one visit, so follow-up evaluations are very important.  Your vital signs today were: BP 123/83   Pulse (!) 56   Temp 98.3 F (36.8 C) (Oral)   Resp 18   Ht 5' 8.5" (1.74 m)   Wt 87.1 kg   SpO2 98%   BMI 28.77 kg/m  If your blood pressure (bp) was elevated above 135/85 this visit, please have this repeated by your doctor within one month. --------------

## 2022-04-14 NOTE — ED Triage Notes (Signed)
C/o abdominal cramps, nausea, diarrhea after taking morning meds. Thinks he took 2 zinc pills instead of 1.

## 2022-04-14 NOTE — ED Provider Notes (Signed)
[facility] EMERGENCY DEPARTMENT Provider Note   CSN: [id number] Arrival date & time: 04/14/22  1028     History  Chief Complaint  Patient presents with   Abdominal Pain    Roger Mitchell is a 41 y.o. male.  Patient presents to the emergency department today for evaluation of upper abdominal pain.  Symptoms started while he was at work.  He describes a sharp stabbing pain in the mid abdomen, above the belly button.  He denies associated vomiting or diarrhea.  No urinary symptoms or flank pain.  Currently pain is 6 out of 10 but was 10 out of 10 when the waves of pain hit.  He denies heavy NSAID use.  He does drink alcohol occasionally per his report.  He does think that he may have inadvertently taken an extra dose or 2 of zinc supplement.  He states that he takes vitamins every morning.  No history of abdominal surgeries.  No treatments prior to arrival.       Home Medications Prior to Admission medications   Medication Sig Start Date End Date Taking? Authorizing Provider  Melatonin 3 MG TABS Take 2 tablets by mouth at bedtime. As needed for sleep    [provider]  metoprolol succinate (TOPROL XL) 25 MG 24 hr tablet Take 1 tablet (25 mg total) by mouth daily. Patient not taking: Reported on 03/30/2022 03/17/20   Lewayne Bunting, MD  naltrexone (DEPADE) 50 MG tablet Take 50 mg by mouth daily. 02/06/21   [provider]      Allergies    Patient has no known allergies.    Review of Systems   Review of Systems  Physical Exam Updated Vital Signs BP 136/83   Pulse 64   Temp 98.3 F (36.8 C) (Oral)   Resp 17   Ht 5' 8.5" (1.74 m)   Wt 87.1 kg   SpO2 100%   BMI 28.77 kg/m   Physical Exam Vitals and nursing note reviewed.  Constitutional:      General: He is not in acute distress.    Appearance: He is well-developed.  HENT:     Head: Normocephalic and atraumatic.  Eyes:     General:        Right eye: No discharge.        Left  eye: No discharge.     Conjunctiva/sclera: Conjunctivae normal.  Cardiovascular:     Rate and Rhythm: Normal rate and regular rhythm.     Heart sounds: Normal heart sounds.  Pulmonary:     Effort: Pulmonary effort is normal.     Breath sounds: Normal breath sounds.  Abdominal:     Palpations: Abdomen is soft.     Tenderness: There is abdominal tenderness (Mild tenderness) in the epigastric area and left upper quadrant. There is no guarding or rebound. Negative signs include Murphy's sign and McBurney's sign.  Musculoskeletal:     Cervical back: Normal range of motion and neck supple.  Skin:    General: Skin is warm and dry.  Neurological:     Mental Status: He is alert.     ED Results / Procedures / Treatments   Labs (all labs ordered are listed, but only abnormal results are displayed) Labs Reviewed  COMPREHENSIVE METABOLIC PANEL - Abnormal; Notable for the following components:      Result Value   Glucose, Bld 111 (*)    All other components within normal limits  CBC WITH DIFFERENTIAL/PLATELET  LIPASE,  BLOOD  URINALYSIS, ROUTINE W REFLEX MICROSCOPIC    EKG None  Radiology No results found.  Procedures Procedures    Medications Ordered in ED Medications  alum & mag hydroxide-simeth (MAALOX/MYLANTA) 200-200-20 MG/5ML suspension 30 mL (30 mLs Oral Given 04/14/22 1337)    ED Course/ Medical Decision Making/ A&P    Patient seen and examined. History obtained directly from patient.   Labs/EKG: Ordered CBC, CMP, lipase, UA.  Imaging: None ordered  Medications/Fluids: Ordered: Maalox for stomach.   Most recent vital signs reviewed and are as follows: BP 136/83   Pulse 64   Temp 98.3 F (36.8 C) (Oral)   Resp 17   Ht 5' 8.5" (1.74 m)   Wt 87.1 kg   SpO2 100%   BMI 28.77 kg/m   Initial impression: Upper abdominal pain  3:44 PM Reassessment performed. Patient appears stable.  Abdomen is soft and nontender.  Patient states that his symptoms improved  after giving blood sample here and have not recurred.  Labs personally reviewed and interpreted including: CBC unremarkable; CMP unremarkable; lipase normal; UA without signs of infection.  Reviewed pertinent lab work and imaging with patient at bedside. Questions answered.   Most current vital signs reviewed and are as follows: BP 123/83   Pulse (!) 56   Temp 98.3 F (36.8 C) (Oral)   Resp 18   Ht 5' 8.5" (1.74 m)   Wt 87.1 kg   SpO2 98%   BMI 28.77 kg/m   Plan: Discharge to home.   Prescriptions written for: Bentyl  Other home care instructions discussed: Encouraged use of Tylenol/Motrin as needed.  Encouraged bland diet.  Encouraged avoidance of supplements for the next few days.  ED return instructions discussed: The patient was urged to return to the Emergency Department immediately with worsening of current symptoms, worsening abdominal pain, persistent vomiting, blood noted in stools, fever, or any other concerns. The patient verbalized understanding.   Follow-up instructions discussed: Patient encouraged to follow-up with their PCP as needed if symptoms recur.                             Medical Decision Making Amount and/or Complexity of Data Reviewed Labs: ordered.  Risk OTC drugs. Prescription drug management.   For this patient's complaint of abdominal pain, the following conditions were considered on the differential diagnosis: gastritis/PUD, enteritis/duodenitis, appendicitis, cholelithiasis/cholecystitis, cholangitis, pancreatitis, ruptured viscus, colitis, diverticulitis, small/large bowel obstruction, proctitis, cystitis, pyelonephritis, ureteral colic, aortic dissection, aortic aneurysm. Atypical chest etiologies were also considered including ACS, PE, and pneumonia.   The patient's vital signs, pertinent lab work and imaging were reviewed and interpreted as discussed in the ED course. Hospitalization was considered for further testing, treatments, or  serial exams/observation. However as patient is well-appearing, has a stable exam, and reassuring studies today, I do not feel that they warrant admission at this time. This plan was discussed with the patient who verbalizes agreement and comfort with this plan and seems reliable and able to return to the Emergency Department with worsening or changing symptoms.          Final Clinical Impression(s) / ED Diagnoses Final diagnoses:  Epigastric abdominal pain    Rx / DC Orders ED Discharge Orders          Ordered    dicyclomine (BENTYL) 20 MG tablet  2 times daily        04/14/22 1541  Renne Crigler, PA-C 04/14/22 1546    Terald Sleeper, MD 04/16/22 [phone]

## 2022-10-18 DIAGNOSIS — B353 Tinea pedis: Secondary | ICD-10-CM | POA: Diagnosis not present

## 2022-12-16 DIAGNOSIS — M791 Myalgia, unspecified site: Secondary | ICD-10-CM | POA: Diagnosis not present

## 2022-12-16 DIAGNOSIS — R051 Acute cough: Secondary | ICD-10-CM | POA: Diagnosis not present

## 2022-12-16 DIAGNOSIS — R0981 Nasal congestion: Secondary | ICD-10-CM | POA: Diagnosis not present

## 2022-12-16 DIAGNOSIS — R5381 Other malaise: Secondary | ICD-10-CM | POA: Diagnosis not present

## 2022-12-16 DIAGNOSIS — U071 COVID-19: Secondary | ICD-10-CM | POA: Diagnosis not present

## 2022-12-19 DIAGNOSIS — U071 COVID-19: Secondary | ICD-10-CM | POA: Diagnosis not present

## 2022-12-19 DIAGNOSIS — R051 Acute cough: Secondary | ICD-10-CM | POA: Diagnosis not present

## 2023-04-15 ENCOUNTER — Telehealth: Payer: Self-pay

## 2023-04-15 NOTE — Telephone Encounter (Signed)
Copied from CRM [id number]. Topic: Clinical - Request for Lab/Test Order >> Apr 15, 2023  2:04 PM Isabell A wrote: Reason for CRM: Patient calling to schedule lab appointment - no orders on file.   Callback number: [phone]

## 2023-04-15 NOTE — Telephone Encounter (Signed)
Pt wanted to sched a physical, but wanted to sched lab appt prior to the physical. Explained to pt that none of the providers in this clinic order labs prior to appointments/seeing the pt.   Pt states he would like to change PCPs. Explained to the pt he was free to do so. He would schedule a TOC appt with the new provider of choice, and the CPE or other concerns would be a subsequent visit. Pt expresses understanding.

## 2023-04-25 ENCOUNTER — Ambulatory Visit: Payer: BC Managed Care – PPO | Admitting: Family Medicine

## 2023-05-19 ENCOUNTER — Encounter: Payer: BC Managed Care – PPO | Admitting: Family Medicine

## 2023-05-20 ENCOUNTER — Encounter: Payer: BC Managed Care – PPO | Admitting: Family Medicine

## 2023-05-27 ENCOUNTER — Encounter: Payer: Self-pay | Admitting: Family

## 2023-05-27 ENCOUNTER — Ambulatory Visit: Payer: BC Managed Care – PPO | Admitting: Family

## 2023-05-27 VITALS — BP 133/78 | HR 65 | Ht 68.5 in | Wt 199.0 lb

## 2023-05-27 DIAGNOSIS — R7989 Other specified abnormal findings of blood chemistry: Secondary | ICD-10-CM

## 2023-05-27 DIAGNOSIS — I4891 Unspecified atrial fibrillation: Secondary | ICD-10-CM | POA: Diagnosis not present

## 2023-05-27 DIAGNOSIS — Z0000 Encounter for general adult medical examination without abnormal findings: Secondary | ICD-10-CM | POA: Diagnosis not present

## 2023-05-27 DIAGNOSIS — Z13220 Encounter for screening for lipoid disorders: Secondary | ICD-10-CM | POA: Diagnosis not present

## 2023-05-27 DIAGNOSIS — F1010 Alcohol abuse, uncomplicated: Secondary | ICD-10-CM

## 2023-05-27 LAB — CBC WITH DIFFERENTIAL/PLATELET
Basophils Absolute: 0 10*3/uL (ref 0.0–0.1)
Basophils Relative: 1 % (ref 0.0–3.0)
Eosinophils Absolute: 0.5 10*3/uL (ref 0.0–0.7)
Eosinophils Relative: 10 % — ABNORMAL HIGH (ref 0.0–5.0)
HCT: 46.3 % (ref 39.0–52.0)
Hemoglobin: 15.8 g/dL (ref 13.0–17.0)
Lymphocytes Relative: 51.1 % — ABNORMAL HIGH (ref 12.0–46.0)
Lymphs Abs: 2.5 10*3/uL (ref 0.7–4.0)
MCHC: 34.2 g/dL (ref 30.0–36.0)
MCV: 91.3 fL (ref 78.0–100.0)
Monocytes Absolute: 0.4 10*3/uL (ref 0.1–1.0)
Monocytes Relative: 8.2 % (ref 3.0–12.0)
Neutro Abs: 1.5 10*3/uL (ref 1.4–7.7)
Neutrophils Relative %: 29.7 % — ABNORMAL LOW (ref 43.0–77.0)
Platelets: 250 10*3/uL (ref 150.0–400.0)
RBC: 5.07 Mil/uL (ref 4.22–5.81)
RDW: 12.4 % (ref 11.5–15.5)
WBC: 4.9 10*3/uL (ref 4.0–10.5)

## 2023-05-27 LAB — COMPREHENSIVE METABOLIC PANEL
ALT: 21 U/L (ref 0–53)
AST: 14 U/L (ref 0–37)
Albumin: 4.7 g/dL (ref 3.5–5.2)
Alkaline Phosphatase: 85 U/L (ref 39–117)
BUN: 11 mg/dL (ref 6–23)
CO2: 29 meq/L (ref 19–32)
Calcium: 9.4 mg/dL (ref 8.4–10.5)
Chloride: 104 meq/L (ref 96–112)
Creatinine, Ser: 1.13 mg/dL (ref 0.40–1.50)
GFR: 80.36 mL/min (ref >60.00)
Glucose, Bld: 78 mg/dL (ref 70–99)
Potassium: 4.5 meq/L (ref 3.5–5.1)
Sodium: 139 meq/L (ref 135–145)
Total Bilirubin: 0.5 mg/dL (ref 0.2–1.2)
Total Protein: 7.1 g/dL (ref 6.0–8.3)

## 2023-05-27 LAB — LIPID PANEL
Cholesterol: 189 mg/dL (ref 0–200)
HDL: 56.3 mg/dL (ref >39.00)
LDL Cholesterol: 117 mg/dL — ABNORMAL HIGH (ref 0–99)
NonHDL: 133.14
Total CHOL/HDL Ratio: 3
Triglycerides: 82 mg/dL (ref 0.0–149.0)
VLDL: 16.4 mg/dL (ref 0.0–40.0)

## 2023-05-27 NOTE — Progress Notes (Signed)
Roger Mitchell is a 43 y.o. male with the following history as recorded in [employer]:  Patient Active Problem List   Diagnosis Date Noted   Other fatigue 03/30/2022   Libido, decreased 03/30/2022   Herpes labialis 03/30/2022   Healthcare maintenance 05/25/2021   Alcohol abuse 05/25/2021   Nicotine dependence, oth tobacco product, w oth disorders 09/23/2017   Adult attention deficit disorder 10/20/2010   BREAST MASS, LEFT 08/04/2006   SYMPTOM, POLYURIA 08/04/2006   PATELLO FEMORAL STRESS SYNDROME 06/23/2006    Current Outpatient Medications  Medication Sig Dispense Refill   Melatonin 3 MG TABS Take 2 tablets by mouth at bedtime. As needed for sleep     naltrexone (DEPADE) 50 MG tablet Take 50 mg by mouth daily.     No current facility-administered medications for this visit.    Allergies: Patient has no known allergies.  Past Medical History:  Diagnosis Date   ADD (attention deficit disorder) without hyperactivity    Atrial fibrillation, persistent (HCC)    Irregular heart beat    Nicotine dependence    Ocular migraine    Pain in testicle    Sleep disorder    shift work   Testicular hypofunction     Past Surgical History:  Procedure Laterality Date   none      Family History  Problem Relation Age of Onset   Healthy Mother    Hypertension Father    Hypertension Maternal Grandmother    Hypertension Maternal Grandfather     Social History   Tobacco Use   Smoking status: Every Day    Types: E-cigarettes   Smokeless tobacco: Never  Substance Use Topics   Alcohol use: Yes    Comment: beer 2-4 glasses per day    Subjective:   Patient presents today to transfer from [place]; would like CPE today; Working with provider (online) for management of alcohol abuse- taking Naltrexone 50 mg; has been able to cut alcohol intake back to one drink every other day; Requesting to have liver functions checked; Records indicate history of Paroxysmal Atrial Fibrillation- has  not been taking medication; would like to be referred back to cardiology to discuss treatment options/ questioning if diagnosis is correct;  +tobacco abuse- not ready to quit smoking at this time;   Review of Systems  Constitutional: Negative.   HENT: Negative.    Eyes: Negative.   Respiratory: Negative.    Cardiovascular: Negative.   Gastrointestinal: Negative.   Genitourinary: Negative.   Musculoskeletal: Negative.   Skin: Negative.   Neurological: Negative.   Endo/Heme/Allergies: Negative.   Psychiatric/Behavioral: Negative.        Objective:  Vitals:   05/27/23 0906  BP: 133/78  Pulse: 65  Weight: 199 lb (90.3 kg)  Height: 5' 8.5" (1.74 m)    General: Well developed, well nourished, in no acute distress  Skin : Warm and dry.  Head: Normocephalic and atraumatic  Eyes: Sclera and conjunctiva clear; pupils round and reactive to light; extraocular movements intact  Ears: External normal; canals clear; tympanic membranes normal  Oropharynx: Pink, supple. No suspicious lesions  Neck: Supple without thyromegaly, adenopathy  Lungs: Respirations unlabored; clear to auscultation bilaterally without wheeze, rales, rhonchi  CVS exam: normal rate and regular rhythm.  Abdomen: Soft; nontender; nondistended; normoactive bowel sounds; no masses or hepatosplenomegaly  Musculoskeletal: No deformities; no active joint inflammation  Extremities: No edema, cyanosis, clubbing  Vessels: Symmetric bilaterally  Neurologic: Alert and oriented; speech intact; face symmetrical; moves all extremities well;  CNII-XII intact without focal deficit  Assessment:  1. PE (physical exam), annual   2. Elevated LFTs   3. Lipid screening   4. Low testosterone   5. Atrial fibrillation, unspecified type (HCC)   6. Alcohol abuse     Plan:  Age appropriate preventive healthcare needs addressed; encouraged regular eye doctor and dental exams; encouraged regular exercise; will update labs and refills as  needed today; follow-up to be determined based on labs and imaging results;  Encouraged patient to work on quitting smoking completely and try to stop drinking completely as well;   No follow-ups on file.  Orders Placed This Encounter  Procedures   US Abdomen Complete    Standing Status:   Future    Expiration Date:   05/26/2024    Reason for Exam (SYMPTOM  OR DIAGNOSIS REQUIRED):   abdominal pain    Preferred imaging location?:   [facility]   CBC with Differential/Platelet   Comp Met (CMET)   Lipid panel   Testosterone Total,Free,Bio, Males-([facility])   Ambulatory referral to Cardiology    Referral Priority:   Routine    Referral Type:   Consultation    Referral Reason:   Specialty Services Required    Referred to Provider:   Lewayne Bunting, MD    Number of Visits Requested:   1    Requested Prescriptions    No prescriptions requested or ordered in this encounter

## 2023-05-28 LAB — TESTOSTERONE TOTAL,FREE,BIO, MALES
Albumin: 4.7 g/dL (ref 3.6–5.1)
Sex Hormone Binding: 18 nmol/L (ref 10–50)
Testosterone, Bioavailable: 141.1 ng/dL (ref 110.0–575.0)
Testosterone, Free: 65.8 pg/mL (ref 46.0–224.0)
Testosterone: 334 ng/dL (ref 250–827)

## 2023-05-30 ENCOUNTER — Encounter: Payer: Self-pay | Admitting: Family

## 2023-05-30 ENCOUNTER — Other Ambulatory Visit: Payer: Self-pay | Admitting: Family

## 2023-05-30 DIAGNOSIS — R7989 Other specified abnormal findings of blood chemistry: Secondary | ICD-10-CM

## 2023-06-13 ENCOUNTER — Ambulatory Visit (HOSPITAL_BASED_OUTPATIENT_CLINIC_OR_DEPARTMENT_OTHER)
Admission: RE | Admit: 2023-06-13 | Discharge: 2023-06-13 | Disposition: A | Discharge status: Home / Self Care | Payer: BC Managed Care – PPO | Source: Ambulatory Visit | Attending: Family | Admitting: Family

## 2023-06-13 DIAGNOSIS — R7989 Other specified abnormal findings of blood chemistry: Secondary | ICD-10-CM | POA: Diagnosis not present

## 2023-06-13 DIAGNOSIS — R109 Unspecified abdominal pain: Secondary | ICD-10-CM | POA: Diagnosis not present

## 2023-06-14 ENCOUNTER — Encounter: Payer: Self-pay | Admitting: Family

## 2023-10-24 NOTE — Progress Notes (Signed)
 HPI: FU atrial fibrillation. Patient apparently was to give blood with [facility] and was found to have an irregular heartbeat. An electrocardiogram January 12, 2018 showed atrial fibrillation. At [facility] patient was in sinus rhythm. Cardiac CTA 1/22 showed calcium score 0 and no CAD.  Echocardiogram June 2023 showed normal LV function.  Since last seen patient denies dyspnea, chest pain or syncope.  When he is stressed he occasionally feels palpitations described as a flutter.  Current Outpatient Medications  Medication Sig Dispense Refill   Melatonin 3 MG TABS Take 2 tablets by mouth at bedtime. As needed for sleep     naltrexone (DEPADE) 50 MG tablet Take 50 mg by mouth daily.     No current facility-administered medications for this visit.     Past Medical History:  Diagnosis Date   ADD (attention deficit disorder) without hyperactivity    Atrial fibrillation, persistent (HCC)    Irregular heart beat    Nicotine dependence    Ocular migraine    Pain in testicle    Sleep disorder    shift work   Testicular hypofunction     Past Surgical History:  Procedure Laterality Date   none      Social History   Socioeconomic History   Marital status: Single    Spouse name: Not on file   Number of children: 1   Years of education: Not on file   Highest education level: Not on file  Occupational History   Not on file  Tobacco Use   Smoking status: Every Day    Types: E-cigarettes   Smokeless tobacco: Never  Vaping Use   Vaping status: Every Day  Substance and Sexual Activity   Alcohol use: Yes    Comment: beer 2-4 glasses per day   Drug use: Not Currently    Types: Marijuana   Sexual activity: Yes  Other Topics Concern   Not on file  Social History Narrative   Not on file   Social Drivers of Health   Financial Resource Strain: Not on file  Food Insecurity: Not on file  Transportation Needs: Not on file  Physical Activity: Not on file  Stress: Not on file   Social Connections: Not on file  Intimate Partner Violence: Not on file    Family History  Problem Relation Age of Onset   Healthy Mother    Hypertension Father    Hypertension Maternal Grandmother    Hypertension Maternal Grandfather     ROS: no fevers or chills, productive cough, hemoptysis, dysphasia, odynophagia, melena, hematochezia, dysuria, hematuria, rash, seizure activity, orthopnea, PND, pedal edema, claudication. Remaining systems are negative.  Physical Exam: Well-developed well-nourished in no acute distress.  Skin is warm and dry.  HEENT is normal.  Neck is supple.  Chest is clear to auscultation with normal expansion.  Cardiovascular exam is regular rate and rhythm.  Abdominal exam nontender or distended. No masses palpated. Extremities show no edema. neuro grossly intact  EKG Interpretation Date/Time:  Monday October 31 2023 09:15:08 EDT Ventricular Rate:  72 PR Interval:  118 QRS Duration:  98 QT Interval:  364 QTC Calculation: 398 R Axis:   62  Text Interpretation: Normal sinus rhythm Normal ECG Confirmed by Pietro Rogue ([id number]) on 10/31/2023 9:16:12 AM    A/P  1 paroxysmal atrial fibrillation-patient remains in sinus rhythm.  Continue Toprol  at present dose.  CHA2DS2-VASc is 0.  He is therefore not anticoagulated.  2 history of chest pain-no recent  symptoms.  Previous CTA showed no coronary artery disease.  3 history of palpitations-I have asked him to consider a smart watch to record any rhythm strips associated with palpitations.  Could consider beta-blockade in the future if necessary.  4 elevated blood pressure reading-I have asked him to check his blood pressure at home.  If systolic is greater than 130 or diastolic greater than 85 we will consider addition of medication.  Redell Shallow, MD

## 2023-10-31 ENCOUNTER — Ambulatory Visit: Attending: Cardiology | Admitting: Cardiology

## 2023-10-31 ENCOUNTER — Encounter: Payer: Self-pay | Admitting: Cardiology

## 2023-10-31 VITALS — BP 140/86 | HR 79 | Resp 16 | Ht 68.5 in | Wt 197.4 lb

## 2023-10-31 DIAGNOSIS — R002 Palpitations: Secondary | ICD-10-CM

## 2023-10-31 DIAGNOSIS — I480 Paroxysmal atrial fibrillation: Secondary | ICD-10-CM

## 2023-10-31 NOTE — Patient Instructions (Signed)

## 2024-02-16 DIAGNOSIS — R051 Acute cough: Secondary | ICD-10-CM | POA: Diagnosis not present

## 2024-02-16 DIAGNOSIS — R0981 Nasal congestion: Secondary | ICD-10-CM | POA: Diagnosis not present

## 2024-02-16 DIAGNOSIS — R062 Wheezing: Secondary | ICD-10-CM | POA: Diagnosis not present

## 2024-02-16 DIAGNOSIS — R61 Generalized hyperhidrosis: Secondary | ICD-10-CM | POA: Diagnosis not present

## 2024-02-16 DIAGNOSIS — R0602 Shortness of breath: Secondary | ICD-10-CM | POA: Diagnosis not present
